# Patient Record
Sex: Female | Born: 1965 | Race: Black or African American | Hispanic: No | Smoking: Former smoker
Health system: Southern US, Community
[De-identification: ages and names within clinical notes are randomized; demographics above are authoritative.]

## PROBLEM LIST (undated history)

## (undated) DIAGNOSIS — T7840XA Allergy, unspecified, initial encounter: Secondary | ICD-10-CM

## (undated) DIAGNOSIS — J45909 Unspecified asthma, uncomplicated: Secondary | ICD-10-CM

## (undated) DIAGNOSIS — D649 Anemia, unspecified: Secondary | ICD-10-CM

## (undated) DIAGNOSIS — G4730 Sleep apnea, unspecified: Secondary | ICD-10-CM

## (undated) DIAGNOSIS — F419 Anxiety disorder, unspecified: Secondary | ICD-10-CM

## (undated) DIAGNOSIS — E119 Type 2 diabetes mellitus without complications: Secondary | ICD-10-CM

## (undated) DIAGNOSIS — I10 Essential (primary) hypertension: Secondary | ICD-10-CM

## (undated) HISTORY — DX: Sleep apnea, unspecified: G47.30

## (undated) HISTORY — DX: Unspecified asthma, uncomplicated: J45.909

## (undated) HISTORY — DX: Allergy, unspecified, initial encounter: T78.40XA

## (undated) HISTORY — DX: Anxiety disorder, unspecified: F41.9

## (undated) HISTORY — DX: Essential (primary) hypertension: I10

## (undated) HISTORY — DX: Type 2 diabetes mellitus without complications: E11.9

---

## 2000-04-18 DIAGNOSIS — R5381 Other malaise: Secondary | ICD-10-CM | POA: Insufficient documentation

## 2001-12-18 DIAGNOSIS — G43909 Migraine, unspecified, not intractable, without status migrainosus: Secondary | ICD-10-CM | POA: Insufficient documentation

## 2002-04-14 DIAGNOSIS — J45909 Unspecified asthma, uncomplicated: Secondary | ICD-10-CM | POA: Insufficient documentation

## 2002-06-25 HISTORY — PX: ABLATION: SHX5711

## 2003-02-16 ENCOUNTER — Observation Stay (HOSPITAL_COMMUNITY): Admission: EM | Admit: 2003-02-16 | Discharge: 2003-02-16 | Payer: Self-pay | Admitting: Plastic Surgery

## 2003-02-26 ENCOUNTER — Ambulatory Visit (HOSPITAL_COMMUNITY): Admission: RE | Admit: 2003-02-26 | Discharge: 2003-02-27 | Payer: Self-pay | Admitting: Internal Medicine

## 2003-10-22 DIAGNOSIS — R609 Edema, unspecified: Secondary | ICD-10-CM | POA: Insufficient documentation

## 2004-05-26 ENCOUNTER — Ambulatory Visit: Payer: Self-pay | Admitting: General Surgery

## 2004-05-26 ENCOUNTER — Other Ambulatory Visit: Payer: Self-pay

## 2004-06-25 DIAGNOSIS — Z87891 Personal history of nicotine dependence: Secondary | ICD-10-CM | POA: Insufficient documentation

## 2007-11-14 ENCOUNTER — Ambulatory Visit: Payer: Self-pay | Admitting: Family Medicine

## 2007-11-17 ENCOUNTER — Ambulatory Visit: Payer: Self-pay | Admitting: Family Medicine

## 2007-12-11 ENCOUNTER — Ambulatory Visit: Payer: Self-pay | Admitting: Cardiology

## 2010-07-05 LAB — HM PAP SMEAR: HM PAP: NEGATIVE

## 2013-02-19 ENCOUNTER — Ambulatory Visit: Payer: Self-pay | Admitting: Family Medicine

## 2013-12-14 DIAGNOSIS — R809 Proteinuria, unspecified: Secondary | ICD-10-CM | POA: Insufficient documentation

## 2014-05-26 LAB — HEMOGLOBIN A1C: HEMOGLOBIN A1C: 6.7 % — AB (ref 4.0–6.0)

## 2014-08-05 LAB — BASIC METABOLIC PANEL
BUN: 11 mg/dL (ref 4–21)
Creatinine: 0.7 mg/dL (ref 0.5–1.1)
GLUCOSE: 91 mg/dL
POTASSIUM: 4.3 mmol/L (ref 3.4–5.3)
SODIUM: 139 mmol/L (ref 137–147)

## 2014-08-05 LAB — LIPID PANEL
CHOLESTEROL: 163 mg/dL (ref 0–200)
HDL: 57 mg/dL (ref 35–70)
LDL CALC: 80 mg/dL
Triglycerides: 130 mg/dL (ref 40–160)

## 2014-09-22 LAB — HM MAMMOGRAPHY

## 2014-12-16 IMAGING — CR DG LUMBAR SPINE 2-3V
1 series · 3 of 3 positions shown · non-contrast
Comparison: none

REASON FOR EXAM: hip pain back pain
COMMENTS:

[Series 1: ap · 0.17mm/px · 3 of 3 slices shown]
[im 1/3]
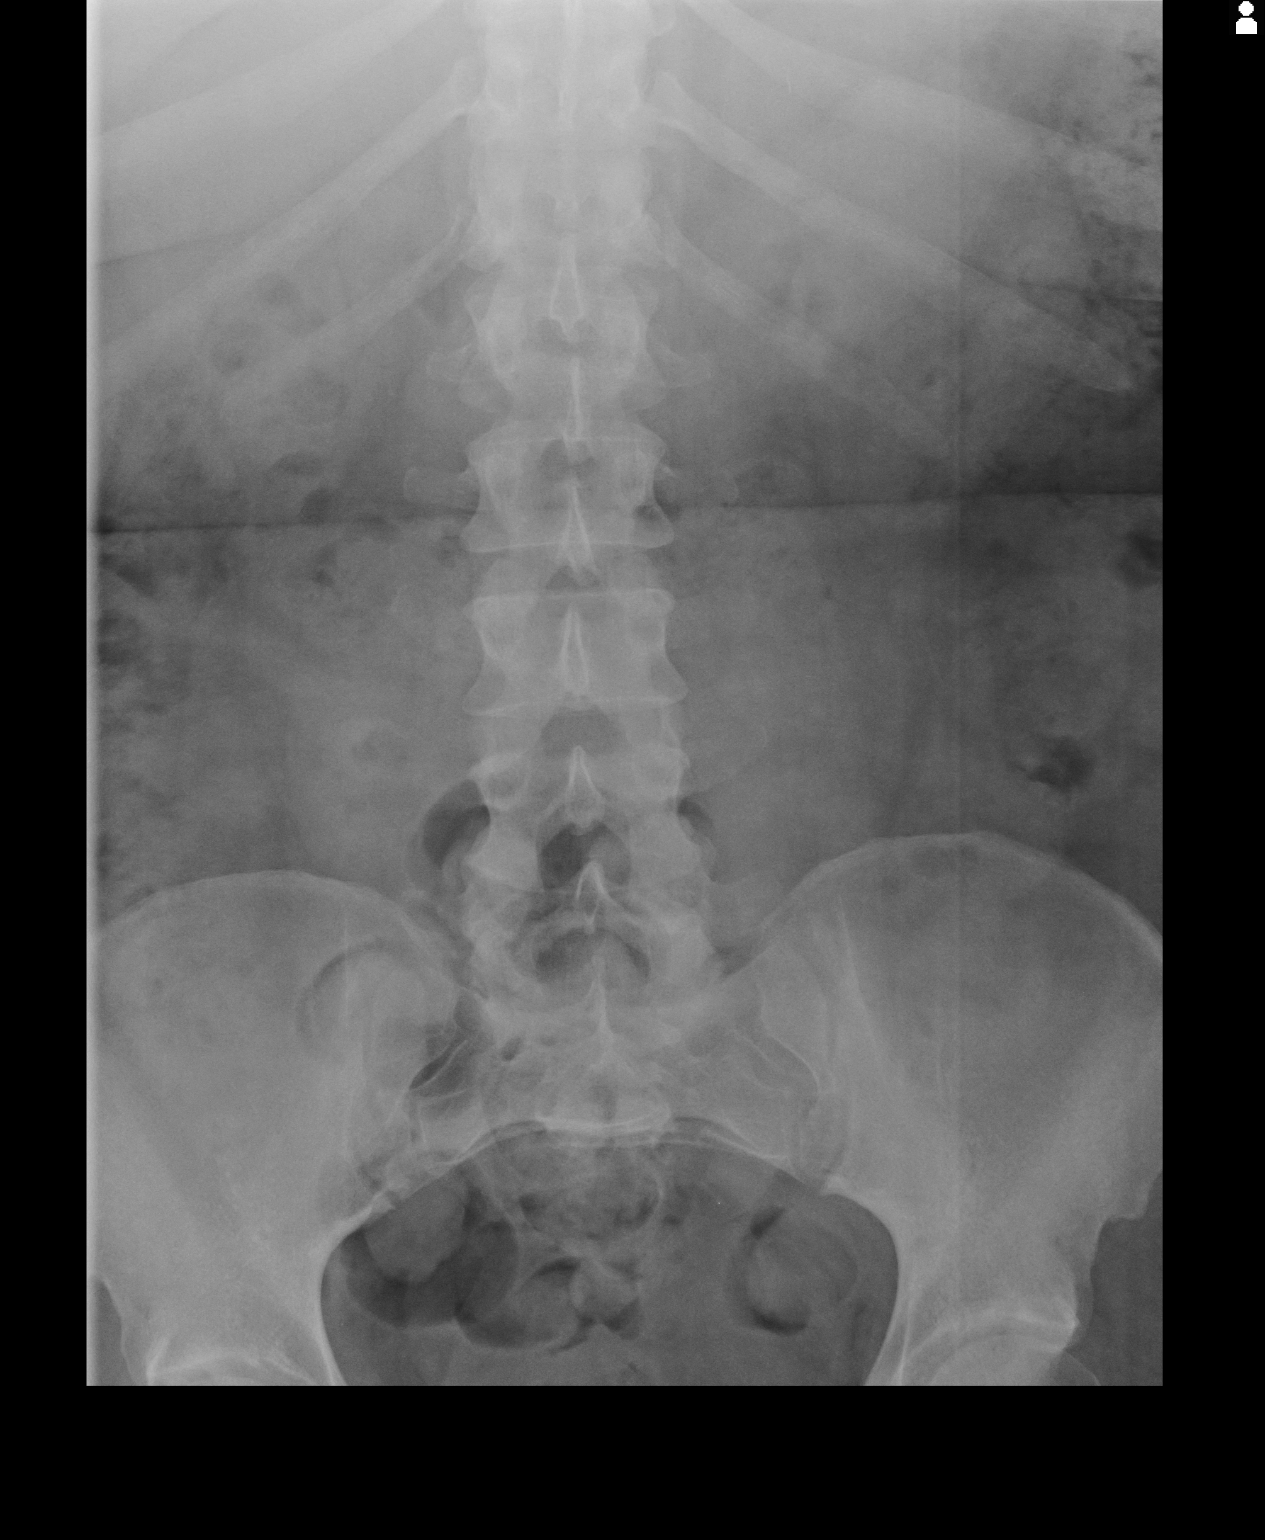
[im 2/3]
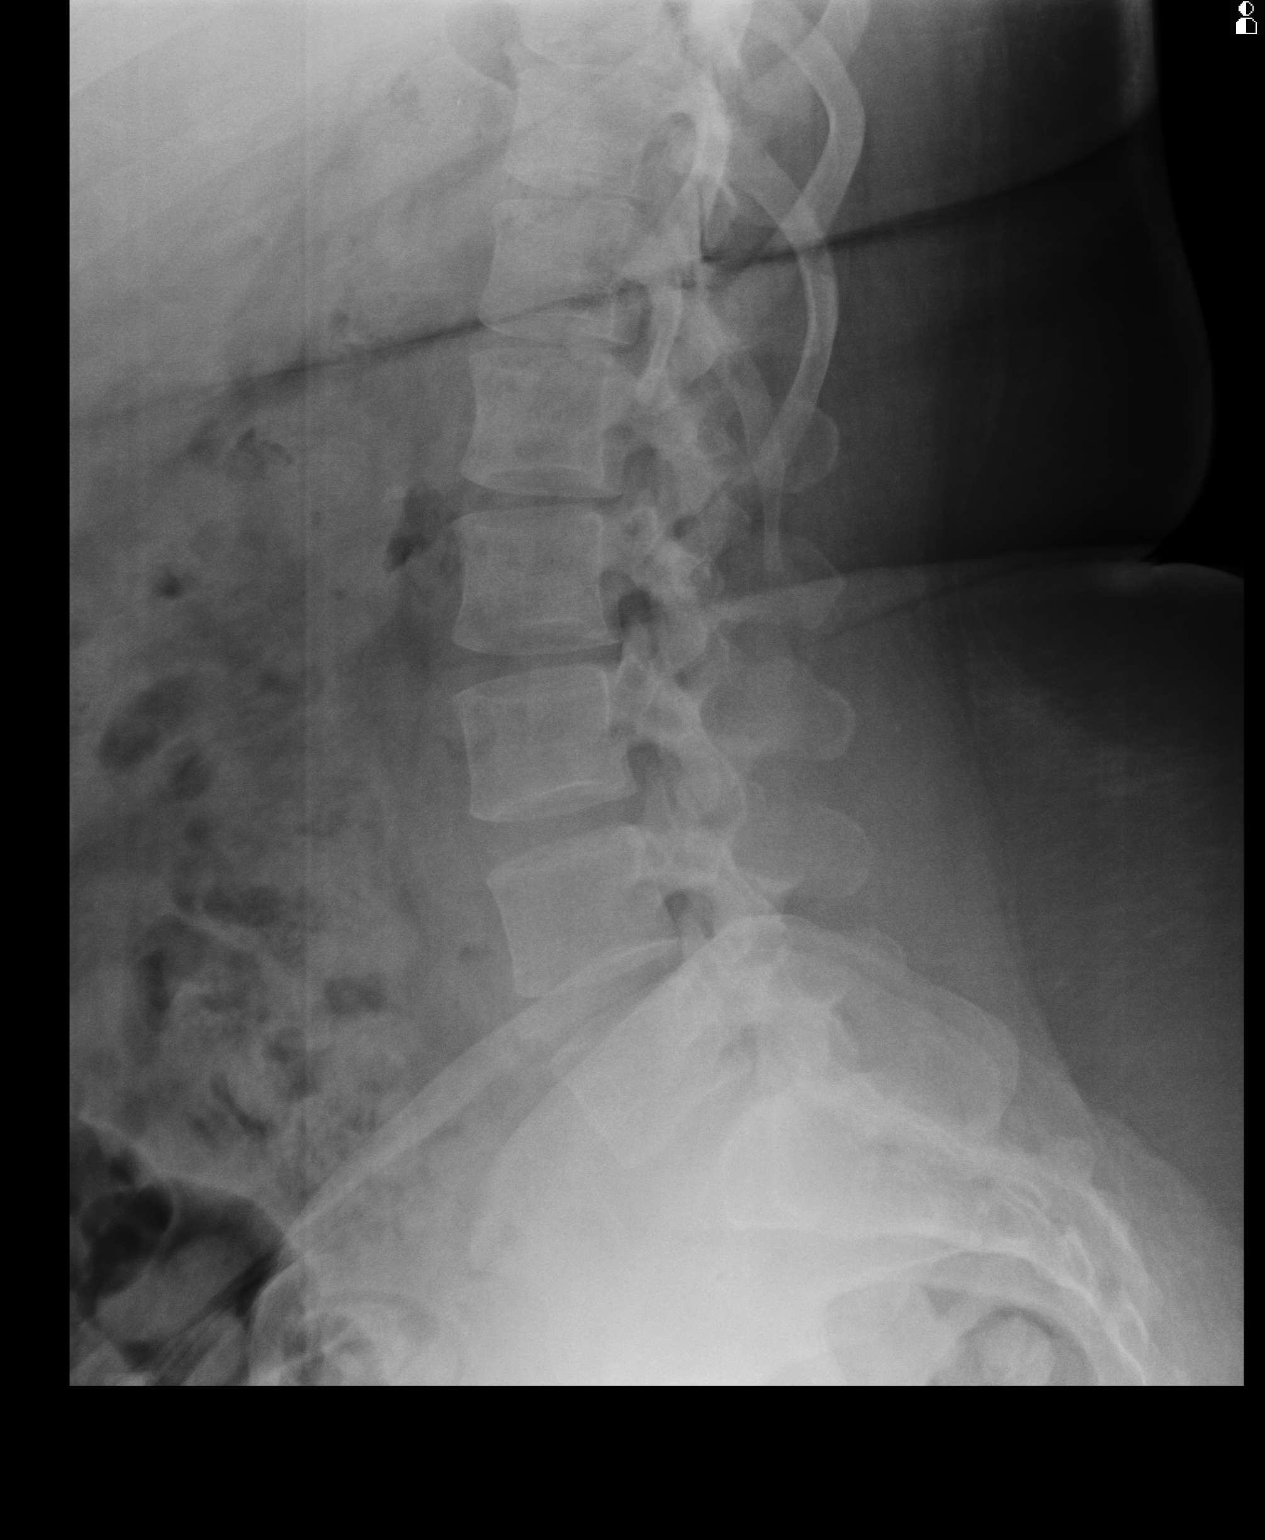
[im 3/3]
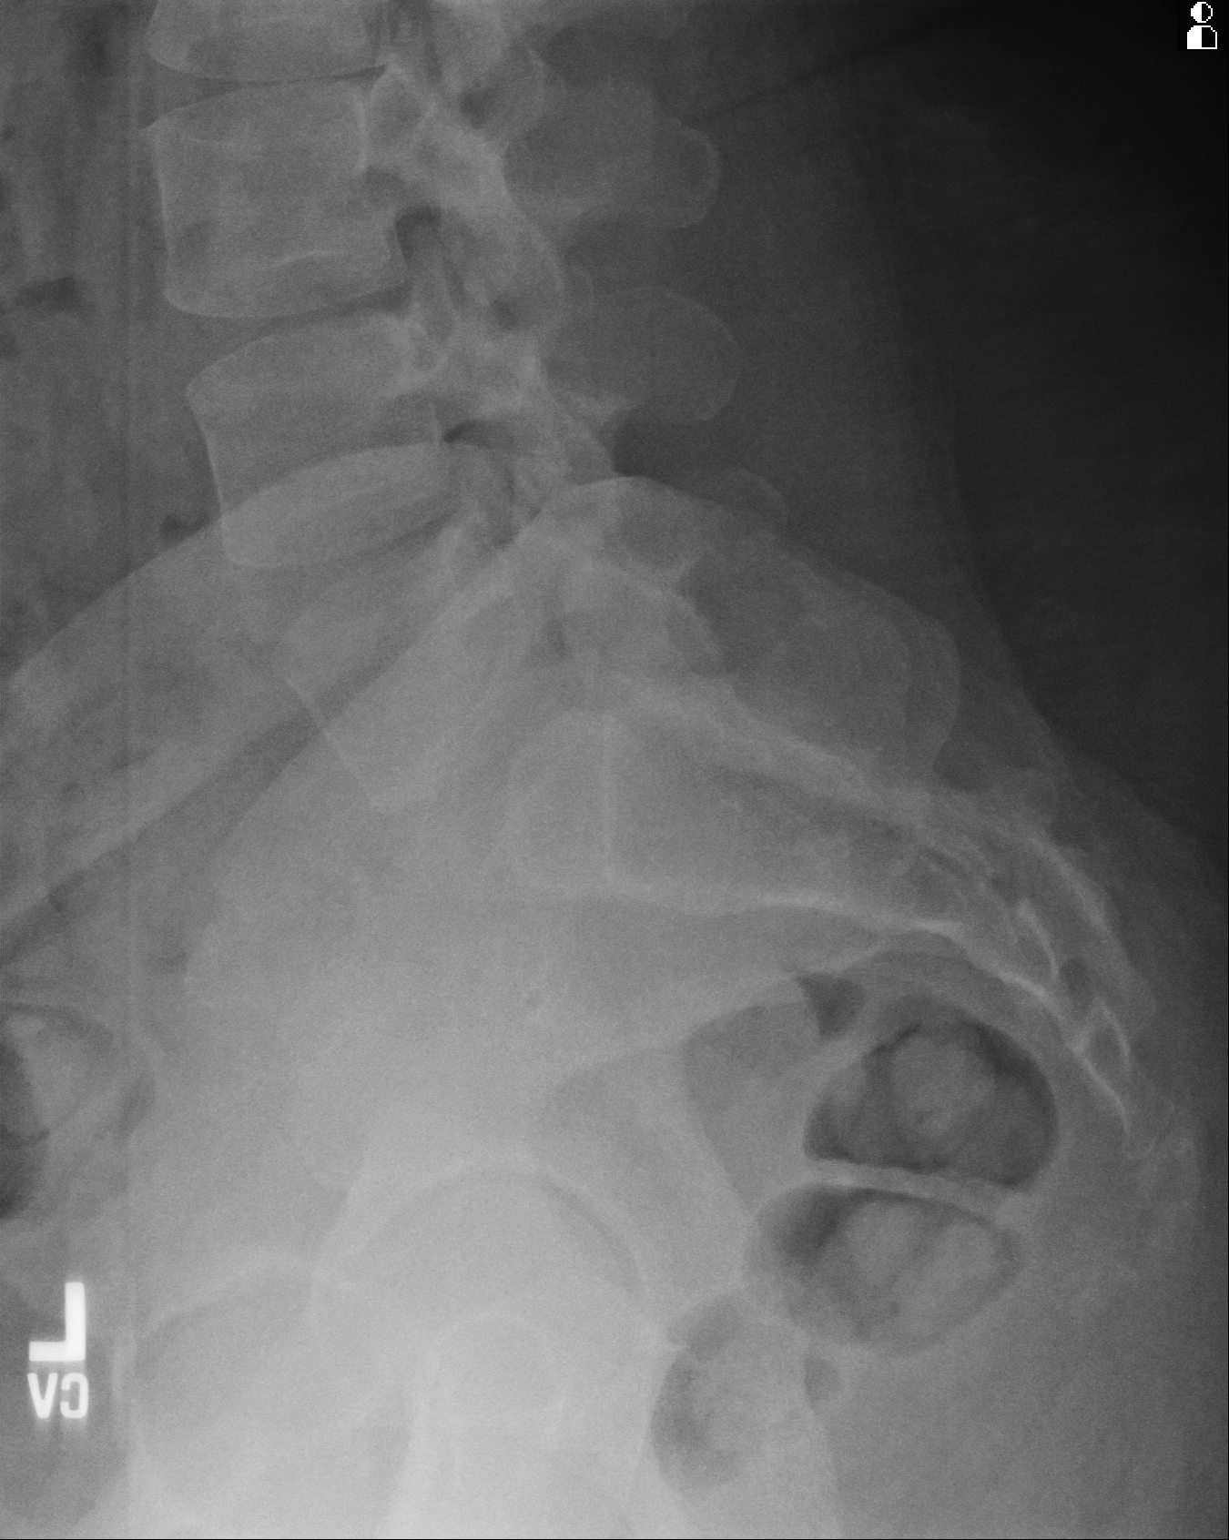

[3 of 3 positions shown; findings below may reference images not displayed]

PROCEDURE:     KDR - KDXR LUMBAR SPINE AP AND LATERAL  - February 19, 2013 [DATE]

RESULT:     The lumbar vertebral bodies are preserved in height. The
intervertebral disc space heights are reasonably well maintained. There is
no pars defect nor spondylolisthesis. The pedicles and transverse processes
appear intact. The observed portions of the sacrum are normal.
IMPRESSION: There is no acute bony abnormality of the lumbar spine nor
evidence of significant degenerative change.

[REDACTED]

## 2015-01-04 ENCOUNTER — Other Ambulatory Visit: Payer: Self-pay | Admitting: Family Medicine

## 2015-01-10 ENCOUNTER — Other Ambulatory Visit: Payer: Self-pay | Admitting: Family Medicine

## 2015-01-17 ENCOUNTER — Ambulatory Visit (INDEPENDENT_AMBULATORY_CARE_PROVIDER_SITE_OTHER): Payer: BC Managed Care – PPO | Admitting: Family Medicine

## 2015-01-17 ENCOUNTER — Encounter: Payer: Self-pay | Admitting: Family Medicine

## 2015-01-17 VITALS — BP 110/68 | HR 85 | Temp 98.7°F | Resp 16 | Wt 262.0 lb

## 2015-01-17 DIAGNOSIS — M25559 Pain in unspecified hip: Secondary | ICD-10-CM | POA: Insufficient documentation

## 2015-01-17 DIAGNOSIS — M549 Dorsalgia, unspecified: Secondary | ICD-10-CM | POA: Insufficient documentation

## 2015-01-17 DIAGNOSIS — G4733 Obstructive sleep apnea (adult) (pediatric): Secondary | ICD-10-CM | POA: Insufficient documentation

## 2015-01-17 DIAGNOSIS — N39498 Other specified urinary incontinence: Secondary | ICD-10-CM | POA: Diagnosis not present

## 2015-01-17 DIAGNOSIS — M545 Low back pain, unspecified: Secondary | ICD-10-CM | POA: Insufficient documentation

## 2015-01-17 DIAGNOSIS — IMO0001 Reserved for inherently not codable concepts without codable children: Secondary | ICD-10-CM | POA: Insufficient documentation

## 2015-01-17 DIAGNOSIS — R229 Localized swelling, mass and lump, unspecified: Secondary | ICD-10-CM

## 2015-01-17 DIAGNOSIS — Z9989 Dependence on other enabling machines and devices: Secondary | ICD-10-CM

## 2015-01-17 NOTE — Progress Notes (Signed)
Patient: Tara Byrd Female    DOB: 10/14/1965   48 y.o.   MRN: [id number] Visit Date: 01/17/2015  Today's Provider: [name], MD   Chief Complaint  Patient presents with  . Mass    intermittently for several years   Subjective:    HPI  Skin lesions    Patient comes in today stating she has multiple knots on her lower legs and feet. Patient reports that these knots have been there for several years. Knots are painful and worsen when walking. Patient states the knots change the color of her skin to a dark brown color. She wishes to see dermatology for further evaluation.    Incontinence: Patient states that for the past 7-8 months she has had incontinence during her menstrual cycle. Patient states this is the only time she experiences incontinence. Patient denies, painful urination or blood in her urine.   Allergies  Allergen Reactions  . Aspirin   . Enalapril Maleate Cough   Previous Medications   ALBUTEROL (VENTOLIN HFA) 108 (90 BASE) MCG/ACT INHALER    Inhale 2 puffs into the lungs every 6 (six) hours as needed.   AMITRIPTYLINE (ELAVIL) 10 MG TABLET    Take 1-2 tablets by mouth every evening.   AMLODIPINE-VALSARTAN-HCTZ 5-160-12.5 MG TABS    Take 1 tablet by mouth daily.   ASPIRIN 81 MG TABLET    Take 1 tablet by mouth daily.   CYANOCOBALAMIN (VITAMIN B-12 CR) 1000 MCG TBCR    Take 1 tablet by mouth daily.   GLUCOSE BLOOD (CLEVER CHEK AUTO-CODE VOICE) TEST STRIP    1 strip. As directed   INSULIN GLARGINE (LANTUS SOLOSTAR) 100 UNIT/ML SOLOSTAR PEN    Inject 30 Units into the skin every morning.   LIRAGLUTIDE (VICTOZA) 18 MG/3ML SOPN    Inject 1.2 mg into the skin daily.   METFORMIN (GLUCOPHAGE-XR) 500 MG 24 HR TABLET    Take 2 tablets by mouth 2 (two) times daily.   MONTELUKAST (SINGULAIR) 10 MG TABLET    TAKE 1 TABLET BY MOUTH ONCE DAILY   NECON 7/7/7 0.5/0.75/1-35 MG-MCG TABLET    TAKE 1 TABLET BY MOUTH ONCE DAILY   PROPRANOLOL (INDERAL) 40 MG TABLET     Take 1 tablet by mouth daily as needed.    Review of Systems  Genitourinary: Negative for dysuria, urgency, frequency, hematuria, flank pain, vaginal bleeding, vaginal discharge, difficulty urinating and vaginal pain.  Musculoskeletal: Positive for myalgias (lower legs and feet).  Skin: Positive for color change.  All other systems reviewed and are negative.   History  Substance Use Topics  . Smoking status: Former Smoker -- 0.50 packs/day for 20 years    Types: Cigarettes    Quit date: 06/25/2002  . Smokeless tobacco: Not on file  . Alcohol Use: No   Objective:   BP 110/68 mmHg  Pulse 85  Temp(Src) 98.7 F (37.1 C) (Oral)  Resp 16  Wt 262 lb (118.842 kg)  SpO2 98%  Physical Exam General Appearance:    Alert, cooperative, no distress, obese  Eyes:    PERRL, conjunctiva/corneas clear, EOM's intact       Lungs:     Clear to auscultation bilaterally, respirations unlabored  Heart:    Regular rate and rhythm  Neurologic:   Awake, alert, oriented x 3. No apparent focal neurological           defect.   Skin:    Several pea sized subcutaneous nodules  on both ankles and feet. Patches of hyperpigmentation around nodules.          Assessment & Plan:     1. Multiple skin nodules  - Ambulatory referral to Dermatology  2. Other urinary incontinence  - Ambulatory referral to Urology        [name], MD  [facility]

## 2015-02-01 ENCOUNTER — Other Ambulatory Visit: Payer: Self-pay | Admitting: Family Medicine

## 2015-02-01 NOTE — Telephone Encounter (Signed)
Last ov was on 01/17/2015

## 2015-02-11 DIAGNOSIS — R32 Unspecified urinary incontinence: Secondary | ICD-10-CM | POA: Insufficient documentation

## 2015-02-14 ENCOUNTER — Other Ambulatory Visit: Payer: Self-pay | Admitting: Family Medicine

## 2015-07-27 ENCOUNTER — Other Ambulatory Visit: Payer: Self-pay | Admitting: Family Medicine

## 2015-08-05 ENCOUNTER — Other Ambulatory Visit: Payer: Self-pay | Admitting: Family Medicine

## 2015-09-01 ENCOUNTER — Other Ambulatory Visit: Payer: Self-pay | Admitting: Family Medicine

## 2015-09-13 ENCOUNTER — Ambulatory Visit (INDEPENDENT_AMBULATORY_CARE_PROVIDER_SITE_OTHER): Payer: BC Managed Care – PPO | Admitting: Family Medicine

## 2015-09-13 ENCOUNTER — Encounter: Payer: Self-pay | Admitting: Family Medicine

## 2015-09-13 VITALS — BP 138/68 | HR 84 | Temp 98.2°F | Resp 16 | Ht 65.0 in | Wt 262.0 lb

## 2015-09-13 DIAGNOSIS — Z124 Encounter for screening for malignant neoplasm of cervix: Secondary | ICD-10-CM | POA: Diagnosis not present

## 2015-09-13 DIAGNOSIS — N951 Menopausal and female climacteric states: Secondary | ICD-10-CM | POA: Diagnosis not present

## 2015-09-13 DIAGNOSIS — E1165 Type 2 diabetes mellitus with hyperglycemia: Secondary | ICD-10-CM | POA: Diagnosis not present

## 2015-09-13 DIAGNOSIS — Z01419 Encounter for gynecological examination (general) (routine) without abnormal findings: Secondary | ICD-10-CM | POA: Diagnosis not present

## 2015-09-13 DIAGNOSIS — Z794 Long term (current) use of insulin: Secondary | ICD-10-CM

## 2015-09-13 DIAGNOSIS — I252 Old myocardial infarction: Secondary | ICD-10-CM | POA: Diagnosis not present

## 2015-09-13 DIAGNOSIS — I10 Essential (primary) hypertension: Secondary | ICD-10-CM | POA: Diagnosis not present

## 2015-09-13 DIAGNOSIS — IMO0001 Reserved for inherently not codable concepts without codable children: Secondary | ICD-10-CM

## 2015-09-13 NOTE — Progress Notes (Signed)
Patient: Tara Byrd, Female    DOB: [birth year], 50 y.o.   MRN: [id number] Visit Date: 09/13/2015  Today's Provider: [name], MD   Chief Complaint  Patient presents with  . Annual Exam  . Follow-up    Vitamin B12 Deficieny  . Hypertension    follow up  . Diabetes    follow up   Subjective:    Annual physical exam Tara Byrd is a 50 y.o. female who presents today for health maintenance and complete physical. She feels fairly well. She reports exercising regularly. She reports she is sleeping well. She does complaint of hot flashes, but her periods remain very regular on current OCPs.   ----------------------------------------------------------------- Follow up Vitamin B12 Deficiency: Last office visit was 2 years ago and no changes were made. Patient reports good compliance with treatment.    Diabetes Mellitus Type II, Follow-up:   Lab Results  Component Value Date   HGBA1C 6.7* 05/26/2014    Last seen for diabetes 2 years ago. Diabetes is Manage by Endocrinologist Dr. Nira Retort Destou. Patient follows up with her Endocrinologist every 3 months.  Management since then includes no changes. She reports good compliance with treatment. She is not having side effects.  Current symptoms include none and have been stable. Most Recent Eye Exam: 2 weeks ago Weight trend: increasing steadily Prior visit with dietician: no Current diet: in general, a "healthy" diet   Current exercise: cardiovascular workout on exercise equipment  Pertinent Labs:    Component Value Date/Time   CHOL 163 08/05/2014   TRIG 130 08/05/2014   HDL 57 08/05/2014   LDLCALC 80 08/05/2014   CREATININE 0.7 08/05/2014    Wt Readings from Last 3 Encounters:  01/17/15 262 lb (118.842 kg)  05/26/14 250 lb (113.399 kg)    ------------------------------------------------------------------------   Hypertension, follow-up:  BP Readings from Last 3 Encounters:  01/17/15 110/68   05/26/14 132/80    She was last seen for hypertension 2 years ago.  BP at that visit was 132/80. Management since that visit includes no changes. She reports good compliance with treatment. She is not having side effects.  She is exercising. She is adherent to low salt diet.   Outside blood pressures are checked at work; patient states it is usually normal. She is experiencing palpitations.  Patient denies chest pain, chest pressure/discomfort, claudication, dyspnea, exertional chest pressure/discomfort, fatigue, irregular heart beat, lower extremity edema, near-syncope, orthopnea and paroxysmal nocturnal dyspnea.   Cardiovascular risk factors include diabetes mellitus and hypertension.  Use of agents associated with hypertension: NSAIDS.     Weight trend: increasing steadily Wt Readings from Last 3 Encounters:  01/17/15 262 lb (118.842 kg)  05/26/14 250 lb (113.399 kg)    Current diet: in general, a "healthy" diet    ------------------------------------------------------------------------  Last labs at [facility] were 07/04/15 with TC=160, Tg=190, HDL=54, LDL=68, VLDL=38, LDL(D)=72.3, and Aic=7.4  Review of Systems  Constitutional: Negative for fever, chills and fatigue.  HENT: Positive for postnasal drip and rhinorrhea. Negative for congestion, ear pain, sneezing and sore throat.   Eyes: Positive for itching. Negative for pain and redness.  Respiratory: Positive for apnea. Negative for cough, shortness of breath and wheezing.   Cardiovascular: Positive for palpitations. Negative for chest pain and leg swelling.  Gastrointestinal: Negative for nausea, abdominal pain, diarrhea, constipation and blood in stool.  Endocrine: Negative for polydipsia and polyphagia.  Genitourinary: Positive for enuresis. Negative for dysuria, hematuria, flank pain, vaginal bleeding, vaginal  discharge and pelvic pain.  Musculoskeletal: Positive for gait problem. Negative for back pain, joint swelling and  arthralgias.  Skin: Negative for rash.  Allergic/Immunologic: Positive for environmental allergies and food allergies.  Neurological: Positive for dizziness and light-headedness. Negative for tremors, seizures, weakness, numbness and headaches.  Hematological: Negative for adenopathy.  Psychiatric/Behavioral: Negative.  Negative for behavioral problems, confusion and dysphoric mood. The patient is not nervous/anxious and is not hyperactive.     Social History      She  reports that she quit smoking about 13 years ago. Her smoking use included Cigarettes. She has a 10 pack-year smoking history. She does not have any smokeless tobacco history on file. She reports that she does not drink alcohol or use illicit drugs.       Social History   Social History  . Marital Status: Divorced    Spouse Name: N/A  . Number of Children: 1  . Years of Education: College   Occupational History  . Employed     works at [employer]   Social History Main Topics  . Smoking status: Former Smoker -- 0.50 packs/day for 20 years    Types: Cigarettes    Quit date: 06/25/2002  . Smokeless tobacco: Not on file  . Alcohol Use: No  . Drug Use: No  . Sexual Activity: Not on file   Other Topics Concern  . Not on file   Social History Narrative    Past Medical History  Diagnosis Date  . Asthma   . Anxiety   . Diabetes mellitus without complication   . Sleep apnea   . Hypertension      Patient Active Problem List   Diagnosis Date Noted  . Menopausal symptoms 09/13/2015  . Back pain 01/17/2015  . Contraception 01/17/2015  . Arthralgia of hip 01/17/2015  . Obstructive sleep apnea 01/17/2015  . Albuminuria 12/14/2013  . Vitamin B12 deficiency anemia 10/14/2009  . Diabetes mellitus type 2, uncontrolled (HCC) 06/29/2009  . Rotator cuff syndrome 02/13/2009  . Chronic nonalcoholic liver disease 11/14/2007  . Contact dermatitis 10/30/2004  . History of tobacco use 06/25/2004  . Edema 10/22/2003  .  Obesity 10/22/2003  . Symptomatic premature ventricular contractions 08/27/2003  . History of MI (myocardial infarction) 06/25/2002  . Asthma without status asthmaticus 04/14/2002  . Allergic rhinitis 03/20/2002  . Anxiety disorder 03/11/2002  . Headache, migraine 12/18/2001  . Essential (primary) hypertension 10/30/1999    Past Surgical History  Procedure Laterality Date  . Ablation  2004    Cardiac  . Cesarean section  85    Family History        Family Status  Relation Status Death Age  . Mother Deceased 72    Cause of death: Renal Failure  . Father Alive   . Sister Alive   . Brother Alive         Her family history includes Asthma in her brother; Colon polyps (age of onset: 82) in her father; Diabetes in her father, mother, and sister; Heart failure in her mother; Kidney disease in her mother; Ulcers in her brother.    Allergies  Allergen Reactions  . Aspirin   . Enalapril Maleate Cough    Previous Medications   ALBUTEROL (VENTOLIN HFA) 108 (90 BASE) MCG/ACT INHALER    Inhale 2 puffs into the lungs every 6 (six) hours as needed.   AMITRIPTYLINE (ELAVIL) 10 MG TABLET    Take 1-2 tablets by mouth every evening.   AMLODIPINE-VALSARTAN-HCTZ 5-160-12.5  MG TABS    TAKE 1 TABLET BY MOUTH ONCE DAILY *NEED TO SCHEDULE OFFICE VISIT*   ASPIRIN 81 MG TABLET    Take 1 tablet by mouth daily.   CYANOCOBALAMIN (VITAMIN B-12 CR) 1000 MCG TBCR    Take 1 tablet by mouth daily.   GLUCOSE BLOOD (CLEVER CHEK AUTO-CODE VOICE) TEST STRIP    1 strip. As directed   INSULIN GLARGINE (BASAGLAR KWIKPEN [place])    Inject 30 Units into the skin every morning. As directed   LIRAGLUTIDE (VICTOZA) 18 MG/3ML SOPN    Inject 1.2 mg into the skin daily.   METFORMIN (GLUCOPHAGE-XR) 500 MG 24 HR TABLET    TAKE 2 TABLETS (1,000 MG TOTAL) BY MOUTH TWICE DAILY   MONTELUKAST (SINGULAIR) 10 MG TABLET    TAKE 1 TABLET BY MOUTH ONCE DAILY   NECON 7/7/7 0.5/0.75/1-35 MG-MCG TABLET    TAKE 1 TABLET BY MOUTH ONCE  DAILY (PATIENT NEEDS TO SCHEDULE VISIT FOR FOLLOW-UP)   PROPRANOLOL (INDERAL) 40 MG TABLET    Take 1 tablet by mouth daily as needed.    Patient Care Team: Malva Limes, MD as PCP - General (Family Medicine) Venetia Night, MD as Referring Physician (Endocrinology)     Objective:   Vitals: BP 138/68 mmHg  Pulse 84  Temp(Src) 98.2 F (36.8 C) (Oral)  Resp 16  Ht  (1.651 m)  Wt 262 lb (118.842 kg)  BMI 43.60 kg/m2  LMP  (Within Weeks)   Physical Exam  General Appearance:    Alert, cooperative, no distress, appears stated age, obese  Head:    Normocephalic, without obvious abnormality, atraumatic  Eyes:    PERRL, conjunctiva/corneas clear, EOM's intact, fundi    benign, both eyes  Ears:    Normal TM's and external ear canals, both ears  Nose:   Nares normal, septum midline, mucosa normal, no drainage    or sinus tenderness  Throat:   Lips, mucosa, and tongue normal; teeth and gums normal  Neck:   Supple, symmetrical, trachea midline, no adenopathy;    thyroid:  no enlargement/tenderness/nodules; no carotid   bruit or JVD  Back:     Symmetric, no curvature, ROM normal, no CVA tenderness  Lungs:     Clear to auscultation bilaterally, respirations unlabored  Chest Wall:    No tenderness or deformity   Heart:    Regular rate and rhythm, S1 and S2 normal, no murmur, rub   or gallop  Breast Exam:    normal appearance, no masses or tenderness  Abdomen:     Soft, non-tender, bowel sounds active all four quadrants,    no masses, no organomegaly  Pelvic:    cervix normal in appearance and external genitalia normal  Extremities:   Extremities normal, atraumatic, no cyanosis or edema  Pulses:   2+ and symmetric all extremities  Skin:   Skin color, texture, turgor normal. Several SK on back chest and neck.   Lymph nodes:   Cervical, supraclavicular, and axillary nodes normal  Neurologic:   CNII-XII intact, normal strength, sensation and reflexes    throughout    Depression  Screen PHQ 2/9 Scores 09/13/2015  PHQ - 2 Score 0  PHQ- 9 Score 1   Current Exercise Habits: Structured exercise class, Type of exercise: treadmill, Time (Minutes): 20, Frequency (Times/Week): 2, Weekly Exercise (Minutes/Week): 40, Intensity: Moderate Exercise limited by: None identified    Assessment & Plan:     Routine Health Maintenance and Physical Exam  Exercise Activities and Dietary recommendations Goals    None      Immunization History  Administered Date(s) Administered  . Pneumococcal Polysaccharide-23 07/25/2012  . Tdap 06/25/2006    Health Maintenance  Topic Date Due  . FOOT EXAM  05/15/1976  . OPHTHALMOLOGY EXAM  05/15/1976  . HIV Screening  05/15/1981  . PAP SMEAR  07/05/2013  . HEMOGLOBIN A1C  11/25/2014  . INFLUENZA VACCINE  01/24/2016 (Originally 01/24/2015)  . TETANUS/TDAP  06/25/2016  . PNEUMOCOCCAL POLYSACCHARIDE VACCINE (2) 07/25/2017      Discussed health benefits of physical activity, and encouraged her to engage in regular exercise appropriate for her age and condition.    --------------------------------------------------------------------  1. Well woman exam with routine gynecological exam  - Comprehensive metabolic panel  2. Essential hypertension Well controlled.  Continue current medications.   - EKG 12-Lead  3. Menopausal symptoms  - FSH - LH  4. Type 2 diabetes mellitus without complication, with long-term current use of insulin (HCC) Continue regular follow up endocrinology.   5. Screening for cervical cancer  - Pap IG (Image Guided)

## 2015-09-14 ENCOUNTER — Other Ambulatory Visit: Payer: Self-pay | Admitting: Family Medicine

## 2015-09-18 LAB — COMPREHENSIVE METABOLIC PANEL
ALBUMIN: 3.8 g/dL (ref 3.5–5.5)
ALK PHOS: 73 IU/L (ref 39–117)
ALT: 9 IU/L (ref 0–32)
AST: 11 IU/L (ref 0–40)
Albumin/Globulin Ratio: 1 — ABNORMAL LOW (ref 1.2–2.2)
BUN / CREAT RATIO: 13 (ref 9–23)
BUN: 9 mg/dL (ref 6–24)
Bilirubin Total: 0.5 mg/dL (ref 0.0–1.2)
CHLORIDE: 98 mmol/L (ref 96–106)
CO2: 24 mmol/L (ref 18–29)
Calcium: 9.6 mg/dL (ref 8.7–10.2)
Creatinine, Ser: 0.72 mg/dL (ref 0.57–1.00)
GFR calc Af Amer: 114 mL/min/{1.73_m2} (ref >59)
GFR calc non Af Amer: 99 mL/min/{1.73_m2} (ref >59)
GLOBULIN, TOTAL: 3.7 g/dL (ref 1.5–4.5)
GLUCOSE: 118 mg/dL — AB (ref 65–99)
Potassium: 3.9 mmol/L (ref 3.5–5.2)
SODIUM: 140 mmol/L (ref 134–144)
Total Protein: 7.5 g/dL (ref 6.0–8.5)

## 2015-09-18 LAB — LUTEINIZING HORMONE: LH: 7.4 m[IU]/mL

## 2015-09-18 LAB — PAP IG (IMAGE GUIDED): PAP SMEAR COMMENT: 0

## 2015-09-18 LAB — FOLLICLE STIMULATING HORMONE: FSH: 14.7 m[IU]/mL

## 2015-09-23 ENCOUNTER — Other Ambulatory Visit: Payer: Self-pay | Admitting: Family Medicine

## 2015-09-27 ENCOUNTER — Encounter: Payer: Self-pay | Admitting: Family Medicine

## 2016-01-13 ENCOUNTER — Other Ambulatory Visit: Payer: Self-pay | Admitting: Family Medicine

## 2016-02-01 ENCOUNTER — Other Ambulatory Visit: Payer: Self-pay | Admitting: Family Medicine

## 2016-03-29 ENCOUNTER — Other Ambulatory Visit: Payer: Self-pay | Admitting: Family Medicine

## 2016-05-21 ENCOUNTER — Telehealth: Payer: Self-pay | Admitting: Family Medicine

## 2016-05-21 DIAGNOSIS — Z1211 Encounter for screening for malignant neoplasm of colon: Secondary | ICD-10-CM

## 2016-05-21 NOTE — Telephone Encounter (Signed)
Please advise 

## 2016-05-21 NOTE — Telephone Encounter (Signed)
Pt called saying it was time for her to have a colonoscopy.   She works at [employer] and would like to be referred there to a gastro.  She is not having any problems but family hx of polyps.  Thanks Barth Kirkseri

## 2016-05-21 NOTE — Telephone Encounter (Signed)
[facility].

## 2016-05-25 ENCOUNTER — Ambulatory Visit: Payer: BC Managed Care – PPO | Admitting: Family Medicine

## 2016-06-05 ENCOUNTER — Ambulatory Visit (INDEPENDENT_AMBULATORY_CARE_PROVIDER_SITE_OTHER): Payer: BC Managed Care – PPO | Admitting: Family Medicine

## 2016-06-05 ENCOUNTER — Encounter: Payer: Self-pay | Admitting: Family Medicine

## 2016-06-05 VITALS — BP 126/72 | Temp 98.4°F | Resp 16 | Wt 261.0 lb

## 2016-06-05 DIAGNOSIS — R29898 Other symptoms and signs involving the musculoskeletal system: Secondary | ICD-10-CM | POA: Diagnosis not present

## 2016-06-05 DIAGNOSIS — M542 Cervicalgia: Secondary | ICD-10-CM | POA: Diagnosis not present

## 2016-06-05 MED ORDER — PREDNISONE 10 MG PO TABS: ORAL_TABLET | ORAL | 0 refills | Status: AC

## 2016-06-05 NOTE — Progress Notes (Addendum)
Patient: Tara Byrd Female    DOB: 1966-06-01   50 y.o.   MRN: [id number] Visit Date: 06/05/2016  Today's Provider: Mila Merryonald Shanaya Schneck, MD   Chief Complaint  Patient presents with  . Neck Pain   Subjective:    HPI Patient reports that she has had neck pain X 2 weeks. Patient reports that she injured her neck when she turned wrong in her bed and had sudden onset of pain and head a pop in her neck. She reports that the pain radiates to both of her shoulders and is constant. Patient also reports that she went to the urgent care last week and they prescribed Norco 5/325mg  and Flexeril 10mg . She reports that she can not tell if the Flexeril has helped with her muscle spasms that she has had in her shoulders. She states Xrays were done at Urgent Care and were normal.     Allergies  Allergen Reactions  . Aspirin   . Enalapril Maleate Cough     Current Outpatient Prescriptions:  .  amitriptyline (ELAVIL) 10 MG tablet, Take 1-2 tablets by mouth every evening., Disp: , Rfl:  .  Amlodipine-Valsartan-HCTZ 5-160-12.5 MG TABS, Take 1 tablet by mouth daily., Disp: 90 tablet, Rfl: 3 .  aspirin 81 MG tablet, Take 1 tablet by mouth daily., Disp: , Rfl:  .  Cyanocobalamin (VITAMIN B-12 CR) 1000 MCG TBCR, Take 1 tablet by mouth daily., Disp: , Rfl:  .  Insulin Glargine (BASAGLAR KWIKPEN [place]), Inject 30 Units into the skin every morning. As directed, Disp: , Rfl:  .  Liraglutide (VICTOZA) 18 MG/3ML SOPN, Inject 1.2 mg into the skin daily., Disp: , Rfl:  .  metFORMIN (GLUCOPHAGE-XR) 500 MG 24 hr tablet, TAKE 2 TABLETS (1,000 MG TOTAL) BY MOUTH TWICE DAILY, Disp: 120 tablet, Rfl: 2 .  montelukast (SINGULAIR) 10 MG tablet, TAKE 1 TABLET BY MOUTH ONCE DAILY, Disp: 30 tablet, Rfl: 5 .  norethindrone-ethinyl estradiol (NECON 7/7/7) 0.5/0.75/1-35 MG-MCG tablet, Take 1 tablet by mouth daily., Disp: 28 tablet, Rfl: 10 .  propranolol (INDERAL) 40 MG tablet, TAKE 1 TABLET (40MG ) BY MOUTH ONCE DAILY AS  NEEDED, Disp: 30 tablet, Rfl: 5 .  VENTOLIN HFA 108 (90 Base) MCG/ACT inhaler, INHALE 2 PUFFS BY MOUTH EVERY 6 HOURS AS NEEDED, Disp: 1 each, Rfl: 5 .  glucose blood (CLEVER CHEK AUTO-CODE VOICE) test strip, 1 strip. As directed, Disp: , Rfl:   Review of Systems  Constitutional: Negative.   Musculoskeletal: Positive for myalgias, neck pain and neck stiffness. Negative for arthralgias, back pain, gait problem and joint swelling.  Neurological: Negative for dizziness, weakness, numbness and headaches.    Social History  Substance Use Topics  . Smoking status: Former Smoker    Packs/day: 0.50    Years: 20.00    Types: Cigarettes    Quit date: 06/25/2002  . Smokeless tobacco: Not on file  . Alcohol use No   Objective:   BP 126/72 (BP Location: Left Arm, Patient Position: Sitting, Cuff Size: Large)   Temp 98.4 F (36.9 C)   Resp 16   Wt 261 lb (118.4 kg)   BMI 43.43 kg/m   Physical Exam   General Appearance:    Alert, cooperative, no distress  Eyes:    PERRL, conjunctiva/corneas clear, EOM's intact       Lungs:     Clear to auscultation bilaterally, respirations unlabored  Heart:    Regular rate and rhythm  Neurologic:   Awake,  alert, oriented x 3. No apparent focal neurological           defect.   MS:   Tender along cervical spine and paracervical muscles. Head rotation limited to 30 degrees secondary to pain. FROM of both shoulders. +4 MS right uUE and +5 LUE. DTRs 1+ Right 2+ left.         Assessment & Plan:     1. Acute neck pain  - predniSONE (DELTASONE) 10 MG tablet; 6 tablets for 2 days, then 5 for 2 days, then 4 for 2 days, then 3 for 2 days, then 2 for 2 days, then 1 for 2 days.  Dispense: 42 tablet; Refill: 0  2. Right arm weakness  - predniSONE (DELTASONE) 10 MG tablet; 6 tablets for 2 days, then 5 for 2 days, then 4 for 2 days, then 3 for 2 days, then 2 for 2 days, then 1 for 2 days.  Dispense: 42 tablet; Refill: 0  Consider MRI if not rapidly improving on  prednisone.     The entirety of the information documented in the History of Present Illness, Review of Systems and Physical Exam were personally obtained by me. Portions of this information were initially documented by Anson Oregonachelle Presley, CMA and reviewed by me for thoroughness and accuracy.    Mila Merryonald Jameica Couts, MD  [facility]

## 2016-06-12 ENCOUNTER — Telehealth: Payer: Self-pay

## 2016-06-12 NOTE — Telephone Encounter (Signed)
LVM for patient to call office and schedule Colonoscopy.

## 2016-06-15 ENCOUNTER — Telehealth: Payer: Self-pay | Admitting: Gastroenterology

## 2016-06-15 NOTE — Telephone Encounter (Signed)
Patient is returning a call regarding scheduling a colonoscopy °

## 2016-07-09 ENCOUNTER — Telehealth: Payer: Self-pay | Admitting: Gastroenterology

## 2016-07-09 NOTE — Telephone Encounter (Signed)
Patient is returning a call regarding a colonoscopy °

## 2016-07-31 ENCOUNTER — Telehealth: Payer: Self-pay | Admitting: Gastroenterology

## 2016-07-31 NOTE — Telephone Encounter (Signed)
[phone] patient stated she has been playing phone tag for about 2 months and needs to schedule her colonoscopy. Please call her at work before 3:30

## 2016-08-01 ENCOUNTER — Other Ambulatory Visit: Payer: Self-pay

## 2016-08-01 NOTE — Telephone Encounter (Signed)
Gastroenterology Pre-Procedure Review  Request Date:  Requesting Physician: Dr.   PATIENT REVIEW QUESTIONS: The patient responded to the following health history questions as indicated:    1. Are you having any GI issues? no 2. Do you have a personal history of Polyps? no 3. Do you have a family history of Colon Cancer or Polyps? no 4. Diabetes Mellitus? yes (Type 2) 5. Joint replacements in the past 12 months?no 6. Major health problems in the past 3 months?no 7. Any artificial heart valves, MVP, or defibrillator?no    MEDICATIONS & ALLERGIES:    Patient reports the following regarding taking any anticoagulation/antiplatelet therapy:   Plavix, Coumadin, Eliquis, Xarelto, Lovenox, Pradaxa, Brilinta, or Effient? no Aspirin? no  Patient confirms/reports the following medications:  Current Outpatient Prescriptions  Medication Sig Dispense Refill  . amitriptyline (ELAVIL) 10 MG tablet Take 1-2 tablets by mouth every evening.    . Amlodipine-Valsartan-HCTZ 5-160-12.5 MG TABS Take 1 tablet by mouth daily. 90 tablet 3  . aspirin 81 MG tablet Take 1 tablet by mouth daily.    . Cyanocobalamin (VITAMIN B-12 CR) 1000 MCG TBCR Take 1 tablet by mouth daily.    Marland Kitchen. glucose blood (CLEVER CHEK AUTO-CODE VOICE) test strip 1 strip. As directed    . Insulin Glargine (BASAGLAR KWIKPEN [place]) Inject 30 Units into the skin every morning. As directed    . Liraglutide (VICTOZA) 18 MG/3ML SOPN Inject 1.2 mg into the skin daily.    . metFORMIN (GLUCOPHAGE-XR) 500 MG 24 hr tablet TAKE 2 TABLETS (1,000 MG TOTAL) BY MOUTH TWICE DAILY 120 tablet 2  . montelukast (SINGULAIR) 10 MG tablet TAKE 1 TABLET BY MOUTH ONCE DAILY 30 tablet 5  . norethindrone-ethinyl estradiol (NECON 7/7/7) 0.5/0.75/1-35 MG-MCG tablet Take 1 tablet by mouth daily. 28 tablet 10  . propranolol (INDERAL) 40 MG tablet TAKE 1 TABLET (40MG ) BY MOUTH ONCE DAILY AS NEEDED 30 tablet 5  . VENTOLIN HFA 108 (90 Base) MCG/ACT inhaler INHALE 2 PUFFS BY MOUTH  EVERY 6 HOURS AS NEEDED 1 each 5   No current facility-administered medications for this visit.     Patient confirms/reports the following allergies:  Allergies  Allergen Reactions  . Aspirin   . Enalapril Maleate Cough    No orders of the defined types were placed in this encounter.   AUTHORIZATION INFORMATION Primary Insurance: 1D#: Group #:  Secondary Insurance: 1D#: Group #:  SCHEDULE INFORMATION: Date: 09/06/16 Time: Location: [facility]

## 2016-08-01 NOTE — Telephone Encounter (Signed)
[facility] with Wohl on 09/06/16.

## 2016-08-14 NOTE — Telephone Encounter (Signed)
Pt scheduled for a colonoscopy on 09/06/16.

## 2016-09-03 ENCOUNTER — Encounter: Payer: Self-pay | Admitting: *Deleted

## 2016-09-04 LAB — HM DIABETES EYE EXAM

## 2016-09-05 NOTE — Discharge Instructions (Signed)

## 2016-09-06 ENCOUNTER — Ambulatory Visit: Payer: BC Managed Care – PPO | Admitting: Anesthesiology

## 2016-09-06 ENCOUNTER — Ambulatory Visit
Admission: RE | Admit: 2016-09-06 | Discharge: 2016-09-06 | Disposition: A | Discharge status: Home / Self Care | Payer: BC Managed Care – PPO | Source: Ambulatory Visit | Attending: Gastroenterology | Admitting: Gastroenterology

## 2016-09-06 ENCOUNTER — Encounter
Admission: RE | Disposition: A | Discharge status: Home / Self Care | Payer: Self-pay | Source: Ambulatory Visit | Attending: Gastroenterology

## 2016-09-06 DIAGNOSIS — Z1211 Encounter for screening for malignant neoplasm of colon: Secondary | ICD-10-CM | POA: Diagnosis present

## 2016-09-06 DIAGNOSIS — I10 Essential (primary) hypertension: Secondary | ICD-10-CM | POA: Insufficient documentation

## 2016-09-06 DIAGNOSIS — Z794 Long term (current) use of insulin: Secondary | ICD-10-CM | POA: Insufficient documentation

## 2016-09-06 DIAGNOSIS — D649 Anemia, unspecified: Secondary | ICD-10-CM | POA: Diagnosis not present

## 2016-09-06 DIAGNOSIS — Z8371 Family history of colonic polyps: Secondary | ICD-10-CM | POA: Insufficient documentation

## 2016-09-06 DIAGNOSIS — K641 Second degree hemorrhoids: Secondary | ICD-10-CM | POA: Diagnosis not present

## 2016-09-06 DIAGNOSIS — Z87891 Personal history of nicotine dependence: Secondary | ICD-10-CM | POA: Insufficient documentation

## 2016-09-06 DIAGNOSIS — J45909 Unspecified asthma, uncomplicated: Secondary | ICD-10-CM | POA: Diagnosis not present

## 2016-09-06 DIAGNOSIS — Z79899 Other long term (current) drug therapy: Secondary | ICD-10-CM | POA: Diagnosis not present

## 2016-09-06 DIAGNOSIS — F419 Anxiety disorder, unspecified: Secondary | ICD-10-CM | POA: Insufficient documentation

## 2016-09-06 DIAGNOSIS — R51 Headache: Secondary | ICD-10-CM | POA: Insufficient documentation

## 2016-09-06 DIAGNOSIS — G4730 Sleep apnea, unspecified: Secondary | ICD-10-CM | POA: Insufficient documentation

## 2016-09-06 DIAGNOSIS — E119 Type 2 diabetes mellitus without complications: Secondary | ICD-10-CM | POA: Diagnosis not present

## 2016-09-06 HISTORY — PX: COLONOSCOPY WITH PROPOFOL: SHX5780

## 2016-09-06 HISTORY — DX: Anemia, unspecified: D64.9

## 2016-09-06 LAB — GLUCOSE, CAPILLARY: GLUCOSE-CAPILLARY: 125 mg/dL — AB (ref 65–99)

## 2016-09-06 SURGERY — COLONOSCOPY WITH PROPOFOL
Anesthesia: Monitor Anesthesia Care | Wound class: Contaminated

## 2016-09-06 MED ORDER — LIDOCAINE HCL (CARDIAC) 20 MG/ML IV SOLN
INTRAVENOUS | Status: DC | PRN
  Administered 2016-09-06: 40 mg via INTRAVENOUS

## 2016-09-06 MED ORDER — PROPOFOL 10 MG/ML IV BOLUS
INTRAVENOUS | Status: DC | PRN
  Administered 2016-09-06: 20 mg via INTRAVENOUS
  Administered 2016-09-06: 70 mg via INTRAVENOUS
  Administered 2016-09-06 (×2): 10 mg via INTRAVENOUS
  Administered 2016-09-06: 20 mg via INTRAVENOUS
  Administered 2016-09-06 (×2): 30 mg via INTRAVENOUS

## 2016-09-06 MED ORDER — LACTATED RINGERS IV SOLN
INTRAVENOUS | Status: DC
  Administered 2016-09-06: 08:00:00 via INTRAVENOUS

## 2016-09-06 MED ORDER — SIMETHICONE 40 MG/0.6ML PO SUSP
ORAL | Status: DC | PRN
  Administered 2016-09-06: 09:00:00

## 2016-09-06 SURGICAL SUPPLY — 23 items
CANISTER SUCT 1200ML W/VALVE (MISCELLANEOUS) ×3 IMPLANT
CLIP HMST 235XBRD CATH ROT (MISCELLANEOUS) IMPLANT
CLIP RESOLUTION 360 11X235 (MISCELLANEOUS)
FCP ESCP3.2XJMB 240X2.8X (MISCELLANEOUS)
FORCEPS BIOP RAD 4 LRG CAP 4 (CUTTING FORCEPS) IMPLANT
FORCEPS BIOP RJ4 240 W/NDL (MISCELLANEOUS)
FORCEPS ESCP3.2XJMB 240X2.8X (MISCELLANEOUS) IMPLANT
GOWN CVR UNV OPN BCK APRN NK (MISCELLANEOUS) ×2 IMPLANT
GOWN ISOL THUMB LOOP REG UNIV (MISCELLANEOUS) ×6
INJECTOR VARIJECT VIN23 (MISCELLANEOUS) IMPLANT
KIT DEFENDO VALVE AND CONN (KITS) IMPLANT
KIT ENDO PROCEDURE OLY (KITS) ×3 IMPLANT
MARKER SPOT ENDO TATTOO 5ML (MISCELLANEOUS) IMPLANT
PAD GROUND ADULT SPLIT (MISCELLANEOUS) IMPLANT
PROBE APC STR FIRE (PROBE) IMPLANT
RETRIEVER NET ROTH 2.5X230 LF (MISCELLANEOUS) IMPLANT
SNARE SHORT THROW 13M SML OVAL (MISCELLANEOUS) IMPLANT
SNARE SHORT THROW 30M LRG OVAL (MISCELLANEOUS) IMPLANT
SNARE SNG USE RND 15MM (INSTRUMENTS) IMPLANT
SPOT EX ENDOSCOPIC TATTOO (MISCELLANEOUS)
TRAP ETRAP POLY (MISCELLANEOUS) IMPLANT
VARIJECT INJECTOR VIN23 (MISCELLANEOUS)
WATER STERILE IRR 250ML POUR (IV SOLUTION) ×3 IMPLANT

## 2016-09-06 NOTE — Op Note (Signed)
[facility] Gastroenterology Patient Name: Tara Byrd Procedure Date: 09/06/2016 8:34 AM MRN: [id number] Account #: [id number] Date of Birth: 03-28-1966 Admit Type: Outpatient Age: [age] [facility] Gender: Female Note Status: Finalized Procedure:            Colonoscopy Indications:          Screening for colorectal malignant neoplasm Providers:            Midge Miniumarren Araf Clugston MD, MD Referring MD:         Demetrios Isaacsonald E. Sherrie MustacheFisher, MD (Referring MD) Medicines:            Propofol per Anesthesia Complications:        No immediate complications. Procedure:            Pre-Anesthesia Assessment:                       - Prior to the procedure, a History and Physical was                        performed, and patient medications and allergies were                        reviewed. The patient's tolerance of previous                        anesthesia was also reviewed. The risks and benefits of                        the procedure and the sedation options and risks were                        discussed with the patient. All questions were                        answered, and informed consent was obtained. Prior                        Anticoagulants: The patient has taken no previous                        anticoagulant or antiplatelet agents. ASA Grade                        Assessment: II - A patient with mild systemic disease.                        After reviewing the risks and benefits, the patient was                        deemed in satisfactory condition to undergo the                        procedure.                       After obtaining informed consent, the colonoscope was                        passed under direct vision. Throughout the procedure,  the patient's blood pressure, pulse, and oxygen                        saturations were monitored continuously. The Masthope (S#: [id number]) was introduced  through                        the anus and advanced to the the cecum, identified by                        appendiceal orifice and ileocecal valve. The                        colonoscopy was performed without difficulty. The                        patient tolerated the procedure well. The quality of                        the bowel preparation was excellent. Findings:      The perianal and digital rectal examinations were normal.      Non-bleeding internal hemorrhoids were found during retroflexion. The       hemorrhoids were Grade II (internal hemorrhoids that prolapse but reduce       spontaneously). Impression:           - Non-bleeding internal hemorrhoids.                       - No specimens collected. Recommendation:       - Discharge patient to home.                       - Resume previous diet.                       - Continue present medications.                       - Repeat colonoscopy in 10 years for screening unless                        any change in family history or lower GI problems. Procedure Code(s):    --- Professional ---                       [phone], Colonoscopy, flexible; diagnostic, including                        collection of specimen(s) by brushing or washing, when                        performed (separate procedure) Diagnosis Code(s):    --- Professional ---                       Z12.11, Encounter for screening for malignant neoplasm                        of colon CPT copyright 2016 American Medical Association. All rights reserved. The  codes documented in this report are preliminary and upon coder review may  be revised to meet current compliance requirements. Midge Minium MD, MD 09/06/2016 8:49:52 AM This report has been signed electronically. Number of Addenda: 0 Note Initiated On: 09/06/2016 8:34 AM Scope Withdrawal Time: 0 hours 6 minutes 9 seconds  Total Procedure Duration: 0 hours 9 minutes 50 seconds       [facility]

## 2016-09-06 NOTE — Transfer of Care (Signed)
Immediate Anesthesia Transfer of Care Note  Patient: Tara Byrd  Procedure(s) Performed: Procedure(s) with comments: COLONOSCOPY WITH PROPOFOL (N/A) - Diabetic - insulin sleep apnea  Patient Location: PACU  Anesthesia Type: MAC  Level of Consciousness: awake, alert  and patient cooperative  Airway and Oxygen Therapy: Patient Spontanous Breathing and Patient connected to supplemental oxygen  Post-op Assessment: Post-op Vital signs reviewed, Patient's Cardiovascular Status Stable, Respiratory Function Stable, Patent Airway and No signs of Nausea or vomiting  Post-op Vital Signs: Reviewed and stable  Complications: No apparent anesthesia complications

## 2016-09-06 NOTE — Anesthesia Preprocedure Evaluation (Signed)
Anesthesia Evaluation  Patient identified by MRN, date of birth, ID band Patient awake    Reviewed: Allergy & Precautions, H&P , NPO status , Patient's Chart, lab work & pertinent test results, reviewed documented beta blocker date and time   Airway Mallampati: III  TM Distance: >3 FB Neck ROM: full    Dental no notable dental hx.    Pulmonary asthma , sleep apnea and Continuous Positive Airway Pressure Ventilation , former smoker,    Pulmonary exam normal breath sounds clear to auscultation       Cardiovascular Exercise Tolerance: Good hypertension,  Rhythm:regular Rate:Normal     Neuro/Psych  Headaches, PSYCHIATRIC DISORDERS (anxiety)    GI/Hepatic negative GI ROS, Neg liver ROS,   Endo/Other  diabetes, Type 2  Renal/GU negative Renal ROS  negative genitourinary   Musculoskeletal   Abdominal   Peds  Hematology negative hematology ROS (+) anemia ,   Anesthesia Other Findings   Reproductive/Obstetrics negative OB ROS                             Anesthesia Physical Anesthesia Plan  ASA: II  Anesthesia Plan: MAC   Post-op Pain Management:    Induction:   Airway Management Planned:   Additional Equipment:   Intra-op Plan:   Post-operative Plan:   Informed Consent: I have reviewed the patients History and Physical, chart, labs and discussed the procedure including the risks, benefits and alternatives for the proposed anesthesia with the patient or authorized representative who has indicated his/her understanding and acceptance.   Dental Advisory Given  Plan Discussed with: CRNA  Anesthesia Plan Comments:         Anesthesia Quick Evaluation

## 2016-09-06 NOTE — H&P (Signed)
Tara Miniumarren Cortina Vultaggio, MD [facility] [address] [place], [place] [address] Phone: [phone] Fax : [phone]  Primary Care Physician:  Tara Merryonald Fisher, MD Primary Gastroenterologist:  Tara Byrd  Pre-Procedure History & Physical: HPI:  Tara Byrd is a 51 y.o. female is here for a screening colonoscopy.   Past Medical History:  Diagnosis Date  . Anemia   . Anxiety   . Asthma   . Diabetes mellitus without complication (HCC)   . Hypertension   . Sleep apnea    CPAP - only uses "sometimes"    Past Surgical History:  Procedure Laterality Date  . ABLATION  2004   Cardiac  . CESAREAN SECTION  1986    Prior to Admission medications   Medication Sig Start Date End Date Taking? Authorizing Provider  amitriptyline (ELAVIL) 10 MG tablet Take 1-2 tablets by mouth every evening. 08/10/09  Yes Historical Provider, MD  Amlodipine-Valsartan-HCTZ 5-160-12.5 MG TABS Take 1 tablet by mouth daily. 09/14/15  Yes Malva Limesonald E Fisher, MD  Cyanocobalamin (VITAMIN B-12 CR) 1000 MCG TBCR Take 1 tablet by mouth daily.   Yes Historical Provider, MD  glucose blood (CLEVER CHEK AUTO-CODE VOICE) test strip 1 strip. As directed   Yes Historical Provider, MD  Insulin Glargine (BASAGLAR KWIKPEN [place]) Inject 30 Units into the skin every morning. As directed 07/04/15  Yes Historical Provider, MD  Liraglutide (VICTOZA) 18 MG/3ML SOPN Inject 1.2 mg into the skin daily.   Yes Historical Provider, MD  metFORMIN (GLUCOPHAGE-XR) 500 MG 24 hr tablet TAKE 2 TABLETS (1,000 MG TOTAL) BY MOUTH TWICE DAILY 02/14/15  Yes Malva Limesonald E Fisher, MD  montelukast (SINGULAIR) 10 MG tablet TAKE 1 TABLET BY MOUTH ONCE DAILY 03/29/16  Yes Malva Limesonald E Fisher, MD  norethindrone-ethinyl estradiol (NECON 7/7/7) 0.5/0.75/1-35 MG-MCG tablet Take 1 tablet by mouth daily. 02/01/16  Yes Malva Limesonald E Fisher, MD  propranolol (INDERAL) 40 MG tablet TAKE 1 TABLET (40MG ) BY MOUTH ONCE DAILY AS NEEDED 01/13/16  Yes Malva Limesonald E Fisher, MD  VENTOLIN HFA 108 [phone](90 Base)  MCG/ACT inhaler INHALE 2 PUFFS BY MOUTH EVERY 6 HOURS AS NEEDED 09/23/15  Yes Malva Limesonald E Fisher, MD    Allergies as of 08/01/2016 - Review Complete 06/05/2016  Allergen Reaction Noted  . Aspirin  01/17/2015  . Enalapril maleate Cough 01/17/2015    Family History  Problem Relation Age of Onset  . Diabetes Mother   . Kidney disease Mother   . Heart failure Mother   . Diabetes Father   . Colon polyps Father [phone]  . Diabetes Sister   . Asthma Brother   . Ulcers Brother     Social History   Social History  . Marital status: Divorced    Spouse name: N/A  . Number of children: 1  . Years of education: College   Occupational History  . Employed     works at [employer]   Social History Main Topics  . Smoking status: Former Smoker    Packs/day: 0.50    Years: 20.00    Types: Cigarettes    Quit date: 06/25/2002  . Smokeless tobacco: Never Used  . Alcohol use No  . Drug use: No  . Sexual activity: Not on file   Other Topics Concern  . Not on file   Social History Narrative  . No narrative on file    Review of Systems: See HPI, otherwise negative ROS  Physical Exam: BP 132/88   Pulse 86   Temp 97.7 F (36.5 C) (Tympanic)  Resp 16   Ht 5\' 5"  (1.651 m)   Wt 257 lb (116.6 kg)   LMP 08/19/2016 Comment: Preg. Test Negative  SpO2 100%   BMI 42.77 kg/m  General:   Alert,  pleasant and cooperative in NAD Head:  Normocephalic and atraumatic. Neck:  Supple; no masses or thyromegaly. Lungs:  Clear throughout to auscultation.    Heart:  Regular rate and rhythm. Abdomen:  Soft, nontender and nondistended. Normal bowel sounds, without guarding, and without rebound.   Neurologic:  Alert and  oriented x4;  grossly normal neurologically.  Impression/Plan: Tara Byrd is now here to undergo a screening colonoscopy.  Risks, benefits, and alternatives regarding colonoscopy have been reviewed with the patient.  Questions have been answered.  All parties agreeable.

## 2016-09-06 NOTE — Anesthesia Postprocedure Evaluation (Signed)
Anesthesia Post Note  Patient: Tara Byrd  Procedure(s) Performed: Procedure(s) (LRB): COLONOSCOPY WITH PROPOFOL (N/A)  Patient location during evaluation: PACU Anesthesia Type: MAC Level of consciousness: awake and alert Pain management: pain level controlled Vital Signs Assessment: post-procedure vital signs reviewed and stable Respiratory status: spontaneous breathing, nonlabored ventilation, respiratory function stable and patient connected to nasal cannula oxygen Cardiovascular status: stable and blood pressure returned to baseline Anesthetic complications: no    Alisa Graff

## 2016-09-06 NOTE — Anesthesia Procedure Notes (Signed)
Procedure Name: MAC Performed by: Mayme Genta Pre-anesthesia Checklist: Patient identified, Emergency Drugs available, Suction available, Timeout performed and Patient being monitored Patient Re-evaluated:Patient Re-evaluated prior to inductionOxygen Delivery Method: Nasal cannula Placement Confirmation: positive ETCO2

## 2016-09-07 ENCOUNTER — Encounter: Payer: Self-pay | Admitting: Gastroenterology

## 2016-09-11 ENCOUNTER — Ambulatory Visit (INDEPENDENT_AMBULATORY_CARE_PROVIDER_SITE_OTHER): Payer: BC Managed Care – PPO | Admitting: Family Medicine

## 2016-09-11 ENCOUNTER — Encounter: Payer: Self-pay | Admitting: Family Medicine

## 2016-09-11 VITALS — BP 128/74 | Temp 98.7°F | Resp 16 | Wt 264.0 lb

## 2016-09-11 DIAGNOSIS — M25511 Pain in right shoulder: Secondary | ICD-10-CM

## 2016-09-11 DIAGNOSIS — M542 Cervicalgia: Secondary | ICD-10-CM | POA: Diagnosis not present

## 2016-09-11 MED ORDER — PREDNISONE 10 MG PO TABS: ORAL_TABLET | ORAL | 0 refills | Status: AC

## 2016-09-11 NOTE — Progress Notes (Signed)
Patient: Tara Byrd Female    DOB: 19-May-1966   50 y.o.   MRN: [id number] Visit Date: 09/11/2016  Today's Provider: Mila Merryonald Emalee Knies, MD   Chief Complaint  Patient presents with  . Shoulder Pain  . Neck Pain   Subjective:    HPI Patient presents  today c/o pain in her shoulder and neck. Patient was seen in the office on 06/05/2016 with similar symptoms. Patient was treated with a prednisone taper which she reports helped quite a bit, but never resolved completely. Pain is now as bad as it was in December.  She reports that it returned with a burning sensation down right arm into right forearm.. Patient reports that she has been taking Aleve and Tylenol with no relief. [facility] FINDINGS:  Cervicothoracic junction is not well visualized on lateral view.   There is no acute fracture or listhesis of the cervical spine. The anterior atlantodental space measures 3 mm.No osseous erosion. No prevertebral soft tissue swelling. Disc spaces are preserved. Vertebral body heights are normal limits. The partially visualized lung apices are clear.     Allergies  Allergen Reactions  . Almond (Diagnostic) Shortness Of Breath    Pt states "nuts" cause SOB  . Aspirin Hives  . Enalapril Maleate Cough  . Tomato Itching    Roma tomatoes only     Current Outpatient Prescriptions:  .  amitriptyline (ELAVIL) 10 MG tablet, Take 1-2 tablets by mouth every evening., Disp: , Rfl:  .  Amlodipine-Valsartan-HCTZ 5-160-12.5 MG TABS, Take 1 tablet by mouth daily., Disp: 90 tablet, Rfl: 3 .  Cyanocobalamin (VITAMIN B-12 CR) 1000 MCG TBCR, Take 1 tablet by mouth daily., Disp: , Rfl:  .  glucose blood (CLEVER CHEK AUTO-CODE VOICE) test strip, 1 strip. As directed, Disp: , Rfl:  .  Insulin Glargine (BASAGLAR KWIKPEN [place]), Inject 30 Units into the skin every morning. As directed, Disp: , Rfl:  .  Liraglutide (VICTOZA) 18 MG/3ML SOPN, Inject 1.2 mg into the skin daily.,  Disp: , Rfl:  .  metFORMIN (GLUCOPHAGE-XR) 500 MG 24 hr tablet, TAKE 2 TABLETS (1,000 MG TOTAL) BY MOUTH TWICE DAILY, Disp: 120 tablet, Rfl: 2 .  montelukast (SINGULAIR) 10 MG tablet, TAKE 1 TABLET BY MOUTH ONCE DAILY, Disp: 30 tablet, Rfl: 5 .  norethindrone-ethinyl estradiol (NECON 7/7/7) 0.5/0.75/1-35 MG-MCG tablet, Take 1 tablet by mouth daily., Disp: 28 tablet, Rfl: 10 .  propranolol (INDERAL) 40 MG tablet, TAKE 1 TABLET (40MG ) BY MOUTH ONCE DAILY AS NEEDED, Disp: 30 tablet, Rfl: 5 .  VENTOLIN HFA 108 (90 Base) MCG/ACT inhaler, INHALE 2 PUFFS BY MOUTH EVERY 6 HOURS AS NEEDED, Disp: 1 each, Rfl: 5  Review of Systems  Constitutional: Positive for activity change.  Musculoskeletal: Positive for myalgias, neck pain and neck stiffness.  Neurological: Positive for numbness and headaches.    Social History  Substance Use Topics  . Smoking status: Former Smoker    Packs/day: 0.50    Years: 20.00    Types: Cigarettes    Quit date: 06/25/2002  . Smokeless tobacco: Never Used  . Alcohol use No   Objective:   BP 128/74 (BP Location: Left Arm, Patient Position: Sitting, Cuff Size: Large)   Temp 98.7 F (37.1 C)   Resp 16   Wt 264 lb (119.7 kg)   LMP 08/19/2016 Comment: Preg. Test Negative  BMI 43.93 kg/m  Vitals:   09/11/16 1618  BP: 128/74  Resp:  16  Temp: 98.7 F (37.1 C)  Weight: 264 lb (119.7 kg)     Physical Exam   General Appearance:    Alert, cooperative, no distress  Eyes:    PERRL, conjunctiva/corneas clear, EOM's intact       Lungs:     Clear to auscultation bilaterally, respirations unlabored  Heart:    Regular rate and rhythm  Neurologic:   Awake, alert, oriented x 3. No apparent focal neurological           defect.   MS:   Diffused tenderness from mid c-spine to right upper trapezius, shoulder and right lateral arm. Pain with internal and external rotation of shoulder.        Assessment & Plan:     1. Neck pain on right side  - Ambulatory referral to  Physical Therapy - predniSONE (DELTASONE) 10 MG tablet; 6 tablets for 2 days, then 5 for 2 days, then 4 for 2 days, then 3 for 2 days, then 2 for 2 days, then 1 for 2 days.  Dispense: 42 tablet; Refill: 0  2. Acute pain of right shoulder Some components of bursitis and cervical radiculopathy.  - predniSONE (DELTASONE) 10 MG tablet; 6 tablets for 2 days, then 5 for 2 days, then 4 for 2 days, then 3 for 2 days, then 2 for 2 days, then 1 for 2 days.  Dispense: 42 tablet; Refill: 0  If not responded to conservative treatment will need cervical MRI.       [name], MD  [facility]

## 2016-09-21 LAB — HM MAMMOGRAPHY

## 2016-10-08 ENCOUNTER — Other Ambulatory Visit: Payer: Self-pay | Admitting: Family Medicine

## 2016-10-12 LAB — HEMOGLOBIN A1C: HEMOGLOBIN A1C: 8.6

## 2016-12-07 ENCOUNTER — Other Ambulatory Visit: Payer: Self-pay | Admitting: Family Medicine

## 2017-04-02 ENCOUNTER — Other Ambulatory Visit: Payer: Self-pay | Admitting: Family Medicine

## 2017-05-21 ENCOUNTER — Other Ambulatory Visit: Payer: Self-pay | Admitting: Family Medicine

## 2017-09-10 LAB — HM DIABETES EYE EXAM

## 2017-10-03 ENCOUNTER — Other Ambulatory Visit: Payer: Self-pay | Admitting: Family Medicine

## 2017-10-03 ENCOUNTER — Telehealth: Payer: Self-pay | Admitting: Family Medicine

## 2017-10-04 LAB — HM MAMMOGRAPHY

## 2017-10-10 ENCOUNTER — Encounter: Payer: Self-pay | Admitting: *Deleted

## 2017-10-22 ENCOUNTER — Encounter: Payer: BC Managed Care – PPO | Admitting: Family Medicine

## 2017-10-25 ENCOUNTER — Encounter: Payer: BC Managed Care – PPO | Admitting: Family Medicine

## 2017-11-11 ENCOUNTER — Other Ambulatory Visit: Payer: Self-pay | Admitting: Family Medicine

## 2018-01-22 ENCOUNTER — Other Ambulatory Visit: Payer: Self-pay | Admitting: *Deleted

## 2018-01-22 MED ORDER — ALBUTEROL SULFATE HFA 108 (90 BASE) MCG/ACT IN AERS: 2.0000 | INHALATION_SPRAY | Freq: Four times a day (QID) | RESPIRATORY_TRACT | 5 refills | Status: DC | PRN

## 2018-03-21 ENCOUNTER — Encounter: Payer: Self-pay | Admitting: Family Medicine

## 2018-03-21 ENCOUNTER — Ambulatory Visit (INDEPENDENT_AMBULATORY_CARE_PROVIDER_SITE_OTHER): Payer: BC Managed Care – PPO | Admitting: Family Medicine

## 2018-03-21 VITALS — BP 104/72 | HR 85 | Temp 98.0°F | Resp 16 | Ht 65.0 in | Wt 231.0 lb

## 2018-03-21 DIAGNOSIS — Z9989 Dependence on other enabling machines and devices: Secondary | ICD-10-CM

## 2018-03-21 DIAGNOSIS — E11649 Type 2 diabetes mellitus with hypoglycemia without coma: Secondary | ICD-10-CM

## 2018-03-21 DIAGNOSIS — N951 Menopausal and female climacteric states: Secondary | ICD-10-CM

## 2018-03-21 DIAGNOSIS — G4733 Obstructive sleep apnea (adult) (pediatric): Secondary | ICD-10-CM

## 2018-03-21 DIAGNOSIS — Z23 Encounter for immunization: Secondary | ICD-10-CM | POA: Diagnosis not present

## 2018-03-21 DIAGNOSIS — Z0000 Encounter for general adult medical examination without abnormal findings: Secondary | ICD-10-CM

## 2018-03-21 DIAGNOSIS — I10 Essential (primary) hypertension: Secondary | ICD-10-CM

## 2018-03-21 DIAGNOSIS — G43809 Other migraine, not intractable, without status migrainosus: Secondary | ICD-10-CM

## 2018-03-21 LAB — POCT UA - MICROALBUMIN: Microalbumin Ur, POC: 20 mg/L

## 2018-03-21 MED ORDER — AMITRIPTYLINE HCL 10 MG PO TABS: 10.0000 mg | ORAL_TABLET | Freq: Every evening | ORAL | 5 refills | Status: DC

## 2018-03-21 NOTE — Progress Notes (Signed)
Patient: Tara Byrd, Female    DOB: 11-05-1965, 52 y.o.   MRN: [id number] Visit Date: 03/21/2018  Today's Provider: [name], MD   Chief Complaint  Patient presents with  . Annual Exam  . Hypertension   Subjective:    Annual physical exam Tara Byrd is a 52 y.o. female who presents today for health maintenance and complete physical. She feels well. She reports exercising yes. She reports she is sleeping well with CPAP and using every night. She stopped her OCPs 3 months ago and states is no longer having periods.   ---------------------------------------------------------------   Hypertension, follow-up:  BP Readings from Last 3 Encounters:  03/21/18 104/72  09/11/16 128/74  09/06/16 108/72    She was last seen for hypertension 09/13/2015.  BP at that visit was 138/68. Management since that visit includes; no changes.She reports good compliance with treatment. She is not having side effects. none She is exercising. She is adherent to low salt diet.   Outside blood pressures are 110/70. She is experiencing none.  Patient denies none.   Cardiovascular risk factors include diabetes mellitus.  Use of agents associated with hypertension: none.   ---------------------------------------------------------------   Type 2 diabetes mellitus without complication, with long-term current use of insulin (HCC) From 09/13/2015-no changes. Continue regular follow up endocrinology with Dr. Gayland Curry. Has a1c was 6.1% lipids and metabolic panel are monitored by endo. She [facility].   Wt Readings from Last 5 Encounters:  03/21/18 231 lb (104.8 kg)  09/11/16 264 lb (119.7 kg)  09/06/16 257 lb (116.6 kg)  06/05/16 261 lb (118.4 kg)  09/13/15 262 lb (118.8 kg)    Review of Systems  Constitutional: Negative for chills, diaphoresis and fever.  HENT: Negative for congestion, ear discharge, ear pain, hearing loss, nosebleeds, sore throat and  tinnitus.   Eyes: Negative for photophobia, pain, discharge and redness.  Respiratory: Positive for apnea. Negative for cough, shortness of breath, wheezing and stridor.   Cardiovascular: Negative for chest pain, palpitations and leg swelling.  Gastrointestinal: Negative for abdominal pain, blood in stool, constipation, diarrhea, nausea and vomiting.  Endocrine: Negative for polydipsia.  Genitourinary: Negative for dysuria, flank pain, frequency, hematuria and urgency.  Musculoskeletal: Negative for back pain, myalgias and neck pain.  Skin: Negative for rash.  Allergic/Immunologic: Positive for environmental allergies and food allergies.  Neurological: Negative for dizziness, tremors, seizures, weakness and headaches.  Hematological: Does not bruise/bleed easily.  Psychiatric/Behavioral: Negative for hallucinations and suicidal ideas. The patient is not nervous/anxious.     Social History      She  reports that she quit smoking about 15 years ago. Her smoking use included cigarettes. She has a 10.00 pack-year smoking history. She has never used smokeless tobacco. She reports that she does not drink alcohol or use drugs.       Social History   Socioeconomic History  . Marital status: Divorced    Spouse name: Not on file  . Number of children: 1  . Years of education: College  . Highest education level: Not on file  Occupational History  . Occupation: Employed    Comment: works at [employer]  . Financial resource strain: Not on file  . Food insecurity:    Worry: Not on file    Inability: Not on file  . Transportation needs:    Medical: Not on file    Non-medical: Not on file  Tobacco Use  . Smoking  status: Former Smoker    Packs/day: 0.50    Years: 20.00    Pack years: 10.00    Types: Cigarettes    Last attempt to quit: 06/25/2002    Years since quitting: 15.7  . Smokeless tobacco: Never Used  Substance and Sexual Activity  . Alcohol use: No    Alcohol/week: 0.0  standard drinks  . Drug use: No  . Sexual activity: Not on file  Lifestyle  . Physical activity:    Days per week: Not on file    Minutes per session: Not on file  . Stress: Not on file  Relationships  . Social connections:    Talks on phone: Not on file    Gets together: Not on file    Attends religious service: Not on file    Active member of club or organization: Not on file    Attends meetings of clubs or organizations: Not on file    Relationship status: Not on file  Other Topics Concern  . Not on file  Social History Narrative  . Not on file    Past Medical History:  Diagnosis Date  . Anemia   . Anxiety   . Asthma   . Diabetes mellitus without complication (HCC)   . Hypertension   . Sleep apnea    CPAP - only uses "sometimes"     Patient Active Problem List   Diagnosis Date Noted  . Special screening for malignant neoplasms, colon   . Menopausal symptoms 09/13/2015  . Absence of bladder continence 02/11/2015  . Back pain 01/17/2015  . Contraception 01/17/2015  . Arthralgia of hip 01/17/2015  . Obstructive sleep apnea 01/17/2015  . Albuminuria 12/14/2013  . Vitamin B12 deficiency anemia 10/14/2009  . Diabetes mellitus type 2, uncontrolled (HCC) 06/29/2009  . Rotator cuff syndrome 02/13/2009  . Chronic nonalcoholic liver disease 11/14/2007  . Contact dermatitis 10/30/2004  . History of tobacco use 06/25/2004  . Edema 10/22/2003  . Obesity 10/22/2003  . Symptomatic premature ventricular contractions 08/27/2003  . History of MI (myocardial infarction) 06/25/2002  . Asthma without status asthmaticus 04/14/2002  . Allergic rhinitis 03/20/2002  . Anxiety disorder 03/11/2002  . Headache, migraine 12/18/2001  . Essential (primary) hypertension 10/30/1999    Past Surgical History:  Procedure Laterality Date  . ABLATION  2004   Cardiac  . CESAREAN SECTION  1986  . COLONOSCOPY WITH PROPOFOL N/A 09/06/2016   Procedure: COLONOSCOPY WITH PROPOFOL;  Surgeon:  Midge Minium, MD;  Location: [facility];  Service: Endoscopy;  Laterality: N/A;  Diabetic - insulin sleep apnea    Family History        Family Status  Relation Name Status  . Mother  Deceased at age 71       Cause of death: Renal Failure  . Father  Alive  . Sister  Alive  . Brother  Alive        Her family history includes Asthma in her brother; Colon polyps (age of onset: 47) in her father; Diabetes in her father, mother, and sister; Heart failure in her mother; Kidney disease in her mother; Ulcers in her brother.      Allergies  Allergen Reactions  . Almond (Diagnostic) Shortness Of Breath    Pt states "nuts" cause SOB  . Aspirin Hives  . Enalapril Maleate Cough  . Tomato Itching    Roma tomatoes only     Current Outpatient Medications:  .  albuterol (VENTOLIN HFA) 108 (90 Base) MCG/ACT  inhaler, Inhale 2 puffs into the lungs every 6 (six) hours as needed., Disp: 1 each, Rfl: 5 .  amitriptyline (ELAVIL) 10 MG tablet, Take 1-2 tablets by mouth every evening., Disp: , Rfl:  .  amLODIPine-Valsartan-HCTZ 5-160-12.5 MG TABS, TAKE 1 TABLET BY MOUTH ONCE DAILY, Disp: 60 tablet, Rfl: 5 .  Cyanocobalamin (VITAMIN B-12 CR) 1000 MCG TBCR, Take 1 tablet by mouth daily., Disp: , Rfl:  .  glucose blood (CLEVER CHEK AUTO-CODE VOICE) test strip, 1 strip. As directed, Disp: , Rfl:  .  Insulin Glargine (BASAGLAR KWIKPEN [place]), Inject 30 Units into the skin every morning. As directed, Disp: , Rfl:  .  metFORMIN (GLUCOPHAGE-XR) 500 MG 24 hr tablet, TAKE 2 TABLETS (1,000 MG TOTAL) BY MOUTH TWICE DAILY (Patient taking differently: 500 mg 2 (two) times daily. ), Disp: 120 tablet, Rfl: 2 .  montelukast (SINGULAIR) 10 MG tablet, TAKE 1 TABLET BY MOUTH ONCE DAILY, Disp: 30 tablet, Rfl: 5 .  propranolol (INDERAL) 40 MG tablet, TAKE 1 TABLET (40MG ) BY MOUTH ONCE DAILY AS NEEDED, Disp: 30 tablet, Rfl: 5 .  Semaglutide, 1 MG/DOSE, 2 MG/1.5ML SOPN, INJECT 1 MG SUBCUTANEOUSLY (UNDER SKIN) EACH WEEK,  Disp: , Rfl:  .  Liraglutide (VICTOZA) 18 MG/3ML SOPN, Inject 1.2 mg into the skin daily., Disp: , Rfl:  .  NORTREL 7/7/7 0.5/0.75/1-35 MG-MCG tablet, TAKE 1 TABLET BY MOUTH ONCE DAILY (Patient not taking: Reported on 03/21/2018), Disp: 28 tablet, Rfl: 0   Patient Care Team: Malva Limes, MD as PCP - General (Family Medicine) Venetia Night, MD as Referring Physician (Endocrinology) System, Provider Not In Raess, Kristi (Optometry)      Objective:   Vitals: BP 104/72 (BP Location: Right Arm, Patient Position: Sitting, Cuff Size: Large)   Pulse 85   Temp 98 F (36.7 C) (Oral)   Resp 16   Ht 5\' 5"  (1.651 m)   Wt 231 lb (104.8 kg)   SpO2 97%   BMI 38.44 kg/m    Vitals:   03/21/18 1016  BP: 104/72  Pulse: 85  Resp: 16  Temp: 98 F (36.7 C)  TempSrc: Oral  SpO2: 97%  Weight: 231 lb (104.8 kg)  Height: 5\' 5"  (1.651 m)     Physical Exam   General Appearance:    Alert, cooperative, no distress, appears stated age, obese  Head:    Normocephalic, without obvious abnormality, atraumatic  Eyes:    PERRL, conjunctiva/corneas clear, EOM's intact, fundi    benign, both eyes  Ears:    Normal TM's and external ear canals, both ears  Nose:   Nares normal, septum midline, mucosa normal, no drainage    or sinus tenderness  Throat:   Lips, mucosa, and tongue normal; teeth and gums normal  Neck:   Supple, symmetrical, trachea midline, no adenopathy;    thyroid:  no enlargement/tenderness/nodules; no carotid   bruit or JVD  Back:     Symmetric, no curvature, ROM normal, no CVA tenderness  Lungs:     Clear to auscultation bilaterally, respirations unlabored  Chest Wall:    No tenderness or deformity   Heart:    Regular rate and rhythm, S1 and S2 normal, no murmur, rub   or gallop  Breast Exam:    normal appearance, no masses or tenderness, deferred  Abdomen:     Soft, non-tender, bowel sounds active all four quadrants,    no masses, no organomegaly  Pelvic:    deferred    Extremities:  Extremities normal, atraumatic, no cyanosis or edema  Pulses:   2+ and symmetric all extremities  Skin:   Skin color, texture, turgor normal, no rashes or lesions  Lymph nodes:   Cervical, supraclavicular, and axillary nodes normal  Neurologic:   CNII-XII intact, normal strength, sensation and reflexes    throughout    Depression Screen PHQ 2/9 Scores 09/13/2015  PHQ - 2 Score 0  PHQ- 9 Score 1      Assessment & Plan:     Routine Health Maintenance and Physical Exam  Exercise Activities and Dietary recommendations Goals   None     Immunization History  Administered Date(s) Administered  . Influenza-Unspecified 02/25/2017  . Pneumococcal Polysaccharide-23 07/25/2012  . Tdap 06/25/2006    Health Maintenance  Topic Date Due  . FOOT EXAM  05/15/1976  . HIV Screening  05/15/1981  . TETANUS/TDAP  06/25/2016  . HEMOGLOBIN A1C  04/13/2017  . INFLUENZA VACCINE  01/23/2018  . OPHTHALMOLOGY EXAM  09/11/2018  . PAP SMEAR  09/13/2018  . MAMMOGRAM  10/05/2018  . COLONOSCOPY  09/07/2026  . PNEUMOCOCCAL POLYSACCHARIDE VACCINE AGE 20-64 HIGH RISK  Completed     Discussed health benefits of physical activity, and encouraged her to engage in regular exercise appropriate for her age and condition.    --------------------------------------------------------------------  1. Annual physical exam  - EKG 12-Lead  2. Controlled type 2 diabetes mellitus  Well controlled.  Continue routine follow up endocrinology.   3. Perimenopausal Off OCP x 3 months with no menstrual cycle.   4. Need for shingles vaccine  - Varicella-zoster vaccine IM (Shingrix)  5. Need for Td vaccine  - Td vaccine greater than or equal to 7yo preservative free IM  6. Essential (primary) hypertension Well controlled.  Continue current medications.    7. Other migraine without status migrainosus, not intractable Doing well with amitriptyline which she takes a few times each week.    8. OSA on CPAP Compliant with and medically benefiting from CPAP use.    [name], MD  [facility]

## 2018-03-31 ENCOUNTER — Other Ambulatory Visit: Payer: Self-pay | Admitting: Family Medicine

## 2018-05-30 ENCOUNTER — Ambulatory Visit (INDEPENDENT_AMBULATORY_CARE_PROVIDER_SITE_OTHER): Payer: BC Managed Care – PPO | Admitting: Family Medicine

## 2018-05-30 DIAGNOSIS — Z23 Encounter for immunization: Secondary | ICD-10-CM

## 2018-06-03 NOTE — Progress Notes (Signed)
Vaccine administration only. No E&M service today.   

## 2018-06-13 LAB — HEMOGLOBIN A1C: Hemoglobin A1C: 6.2

## 2018-07-04 ENCOUNTER — Encounter: Payer: Self-pay | Admitting: Family Medicine

## 2018-07-04 NOTE — Telephone Encounter (Signed)
This has been abstracted.   Thanks,   -Vernona Rieger

## 2018-07-12 NOTE — Telephone Encounter (Signed)
encounter opened in error, closed for administrative reasons.

## 2018-09-26 ENCOUNTER — Other Ambulatory Visit: Payer: Self-pay | Admitting: Family Medicine

## 2018-10-22 ENCOUNTER — Other Ambulatory Visit: Payer: Self-pay | Admitting: Family Medicine

## 2018-10-22 NOTE — Telephone Encounter (Signed)
Please advise. This medication has not been refilled since 01/13/2016.   She also wanted advise on symptoms of abdominal pain after eating and bloating. I was going to advise her that she needs an office visit for evaluation, unless you suggest something else. Please advise.

## 2018-10-22 NOTE — Telephone Encounter (Signed)
Pt needs refill on  Propranolol 40 mg  [facility]  CB#  [phone]  Pt also states after she eats that she is having pain and bloating  Please call her back about this  Thanks teri

## 2018-10-23 MED ORDER — PROPRANOLOL HCL 40 MG PO TABS: ORAL_TABLET | ORAL | 5 refills | Status: DC

## 2018-10-23 NOTE — Telephone Encounter (Signed)
She should make an appointment to evaluate abdominal pain

## 2018-12-01 ENCOUNTER — Telehealth: Payer: Self-pay | Admitting: Family Medicine

## 2018-12-01 NOTE — Telephone Encounter (Signed)
Pt wants to know if Dr. Caryn Section will prescribe her Linzess.  She has used this before and it works for her  CB#  [phone]

## 2018-12-01 NOTE — Telephone Encounter (Signed)
Left message for pt to call back and schedule an appointment.  Please schedule when pt calls back.   Thanks,   -Mickel Baas

## 2018-12-01 NOTE — Telephone Encounter (Signed)
Patient advised as below.  

## 2018-12-01 NOTE — Telephone Encounter (Signed)
I've never prescribed this for her before. Need more info. Probably needs office visit.

## 2018-12-12 ENCOUNTER — Ambulatory Visit: Payer: Self-pay | Admitting: Family Medicine

## 2018-12-16 LAB — HM DIABETES EYE EXAM

## 2018-12-30 ENCOUNTER — Encounter: Payer: Self-pay | Admitting: Family Medicine

## 2019-02-10 LAB — HM MAMMOGRAPHY

## 2019-02-11 ENCOUNTER — Encounter: Payer: Self-pay | Admitting: Family Medicine

## 2019-03-30 ENCOUNTER — Other Ambulatory Visit: Payer: Self-pay | Admitting: Family Medicine

## 2019-03-31 ENCOUNTER — Ambulatory Visit: Payer: BC Managed Care – PPO | Admitting: Family Medicine

## 2019-05-29 LAB — BASIC METABOLIC PANEL
BUN: 27 — AB (ref 4–21)
Creatinine: 1.1 (ref 0.5–1.1)
Potassium: 4.8 (ref 3.4–5.3)

## 2019-05-29 LAB — LIPID PANEL
Cholesterol: 183 (ref 0–200)
HDL: 40 (ref 35–70)
LDL Cholesterol: 100
Triglycerides: 217 — AB (ref 40–160)

## 2019-05-29 LAB — HEMOGLOBIN A1C: Hemoglobin A1C: 6.7

## 2019-05-29 LAB — HEPATIC FUNCTION PANEL: ALT: 18 (ref 7–35)

## 2019-05-29 LAB — VITAMIN D 25 HYDROXY (VIT D DEFICIENCY, FRACTURES): Vit D, 25-Hydroxy: 8.8

## 2019-05-29 LAB — TSH: TSH: 1.81 (ref 0.41–5.90)

## 2019-06-08 DIAGNOSIS — E559 Vitamin D deficiency, unspecified: Secondary | ICD-10-CM | POA: Insufficient documentation

## 2019-06-17 ENCOUNTER — Ambulatory Visit: Payer: BC Managed Care – PPO | Admitting: Family Medicine

## 2019-06-29 ENCOUNTER — Other Ambulatory Visit: Payer: Self-pay | Admitting: Family Medicine

## 2019-06-29 NOTE — Telephone Encounter (Signed)
Requested medication (s) are due for refill today: no  Requested medication (s) are on the active medication list: yes  Last refill:  12/30/2018  Future visit scheduled: yes  Notes to clinic:  no valid encounter within last 12 months   Requested Prescriptions  Pending Prescriptions Disp Refills   albuterol (VENTOLIN HFA) 108 (90 Base) MCG/ACT inhaler [Pharmacy Med Name: albuterol HFA 90 mcg/actuation inhaler] 18 g 5    Sig: INHALE 2 PUFFS INTO THE LUNGS EVERY 6 HOURS AS NEEDED      Pulmonology:  Beta Agonists Failed - 06/29/2019  7:36 AM      Failed - One inhaler should last at least one month. If the patient is requesting refills earlier, contact the patient to check for uncontrolled symptoms.      Failed - Valid encounter within last 12 months    Recent Outpatient Visits           1 year ago Need for shingles vaccine   [facility] Malva Limes, MD   1 year ago Annual physical exam   [facility] Malva Limes, MD   2 years ago Neck pain on right side   [facility] Malva Limes, MD   3 years ago Acute neck pain   [facility] Malva Limes, MD   3 years ago Menopausal symptoms   [facility] Sherrie Mustache, Demetrios Isaacs, MD

## 2019-07-27 ENCOUNTER — Encounter: Payer: BC Managed Care – PPO | Admitting: Family Medicine

## 2019-07-29 ENCOUNTER — Encounter: Payer: Self-pay | Admitting: Family Medicine

## 2019-08-17 MED ORDER — AMLODIPINE BESYLATE 5 MG PO TABS: 5.0000 mg | ORAL_TABLET | Freq: Every day | ORAL | 0 refills | Status: DC

## 2019-08-17 MED ORDER — AMLODIPINE BESYLATE 10 MG PO TABS: 10.0000 mg | ORAL_TABLET | Freq: Every day | ORAL | 0 refills | Status: DC

## 2019-08-17 MED ORDER — VALSARTAN-HYDROCHLOROTHIAZIDE 160-12.5 MG PO TABS: 1.0000 | ORAL_TABLET | Freq: Every day | ORAL | 0 refills | Status: DC

## 2019-08-28 ENCOUNTER — Other Ambulatory Visit: Payer: Self-pay | Admitting: Family Medicine

## 2019-08-28 ENCOUNTER — Encounter: Payer: BC Managed Care – PPO | Admitting: Family Medicine

## 2019-08-28 NOTE — Telephone Encounter (Signed)
Requested Prescriptions  Pending Prescriptions Disp Refills  . albuterol (VENTOLIN HFA) 108 (90 Base) MCG/ACT inhaler [Pharmacy Med Name: albuterol HFA 90 mcg/actuation inhaler] 18 g 5    Sig: INHALE 2 PUFFS INTO THE LUNGS EVERY 6 HOURS AS NEEDED     Pulmonology:  Beta Agonists Failed - 08/28/2019  7:35 AM      Failed - One inhaler should last at least one month. If the patient is requesting refills earlier, contact the patient to check for uncontrolled symptoms.      Failed - Valid encounter within last 12 months    Recent Outpatient Visits          1 year ago Need for shingles vaccine   [facility] Malva Limes, MD   1 year ago Annual physical exam   [facility] Malva Limes, MD   2 years ago Neck pain on right side   [facility] Malva Limes, MD   3 years ago Acute neck pain   [facility] Malva Limes, MD   3 years ago Menopausal symptoms   [facility], Demetrios Isaacs, MD      Future Appointments            In 3 days Fisher, Demetrios Isaacs, MD [facility]

## 2019-08-31 ENCOUNTER — Other Ambulatory Visit: Payer: Self-pay

## 2019-08-31 ENCOUNTER — Ambulatory Visit (INDEPENDENT_AMBULATORY_CARE_PROVIDER_SITE_OTHER): Payer: BC Managed Care – PPO | Admitting: Family Medicine

## 2019-08-31 ENCOUNTER — Encounter: Payer: Self-pay | Admitting: Family Medicine

## 2019-08-31 VITALS — BP 106/73 | HR 87 | Temp 97.3°F | Ht 65.0 in | Wt 263.6 lb

## 2019-08-31 DIAGNOSIS — E119 Type 2 diabetes mellitus without complications: Secondary | ICD-10-CM

## 2019-08-31 DIAGNOSIS — K5909 Other constipation: Secondary | ICD-10-CM

## 2019-08-31 DIAGNOSIS — Z0000 Encounter for general adult medical examination without abnormal findings: Secondary | ICD-10-CM

## 2019-08-31 DIAGNOSIS — I10 Essential (primary) hypertension: Secondary | ICD-10-CM | POA: Diagnosis not present

## 2019-08-31 DIAGNOSIS — G4733 Obstructive sleep apnea (adult) (pediatric): Secondary | ICD-10-CM | POA: Diagnosis not present

## 2019-08-31 DIAGNOSIS — F419 Anxiety disorder, unspecified: Secondary | ICD-10-CM

## 2019-08-31 DIAGNOSIS — Z9989 Dependence on other enabling machines and devices: Secondary | ICD-10-CM

## 2019-08-31 DIAGNOSIS — E559 Vitamin D deficiency, unspecified: Secondary | ICD-10-CM

## 2019-08-31 NOTE — Progress Notes (Signed)
Patient: Tara Byrd, Female    DOB: 09/20/1965, 54 y.o.   MRN: [id number] Visit Date: 08/31/2019  Today's Provider: [name], MD   Chief Complaint  Patient presents with  . Annual Exam  . Hypertension  . Diabetes   Subjective:     Annual physical exam Tara Byrd is a 54 y.o. female who presents today for health maintenance and complete physical. She feels well. She reports exercising walking daily. She reports she is sleeping poorly.  -----------------------------------------------------------------  Hypertension, follow-up:     BP Readings from Last 3 Encounters:  03/21/18 104/72  09/11/16 128/74  09/06/16 108/72    She was last seen for hypertension 03/21/2018 BP at that visit was 104/72. Management since that visit includes no changes. She reports good compliance with treatment. She is not having side effects. none She is exercising. She is adherent to low salt diet.   Outside blood pressures are not being checked at home. She is experiencing none.  Patient denies none.   Cardiovascular risk factors include diabetes mellitus.  Use of agents associated with hypertension: none.     ---------------------------------------------------------------  Type 2 diabetes mellitus without complication, with long-term current use of insulin (HCC) From 03/21/2018 Patient is followed by endocrinology with Dr. Gayland Curry.   Lab Results  Component Value Date   HGBA1C 6.7 05/29/2019   Her only complaint today is longstanding history of constipation that has worsened over the last several months. She is no on Ozempic prescribed by her endocrinologist and thinks maybe constipation has worsened since then. Has tried OTC stool softeners which did not help and OTC Miralax which just causes bloating and gassiness.    Review of Systems  Constitutional: Negative.   HENT: Negative.   Eyes: Negative.   Respiratory: Negative.   Cardiovascular: Negative.     Gastrointestinal: Negative.   Endocrine: Negative.   Genitourinary: Negative.   Musculoskeletal: Negative.   Skin: Negative.   Allergic/Immunologic: Negative.   Neurological: Negative.   Hematological: Negative.   Psychiatric/Behavioral: Negative.     Social History      She  reports that she quit smoking about 17 years ago. Her smoking use included cigarettes. She has a 10.00 pack-year smoking history. She has never used smokeless tobacco. She reports that she does not drink alcohol or use drugs.       Social History   Socioeconomic History  . Marital status: Divorced    Spouse name: Not on file  . Number of children: 1  . Years of education: College  . Highest education level: Not on file  Occupational History  . Occupation: Employed    Comment: works at [employer]  . Smoking status: Former Smoker    Packs/day: 0.50    Years: 20.00    Pack years: 10.00    Types: Cigarettes    Quit date: 06/25/2002    Years since quitting: 17.1  . Smokeless tobacco: Never Used  Substance and Sexual Activity  . Alcohol use: No    Alcohol/week: 0.0 standard drinks  . Drug use: No  . Sexual activity: Not on file  Other Topics Concern  . Not on file  Social History Narrative  . Not on file   Social Determinants of Health   Financial Resource Strain:   . Difficulty of Paying Living Expenses: Not on file  Food Insecurity:   . Worried About Programme researcher, broadcasting/film/video in the Last Year: Not on file  .  Ran Out of Food in the Last Year: Not on file  Transportation Needs:   . Lack of Transportation (Medical): Not on file  . Lack of Transportation (Non-Medical): Not on file  Physical Activity:   . Days of Exercise per Week: Not on file  . Minutes of Exercise per Session: Not on file  Stress:   . Feeling of Stress : Not on file  Social Connections:   . Frequency of Communication with Friends and Family: Not on file  . Frequency of Social Gatherings with Friends and Family: Not on file   . Attends Religious Services: Not on file  . Active Member of Clubs or Organizations: Not on file  . Attends Banker Meetings: Not on file  . Marital Status: Not on file    Past Medical History:  Diagnosis Date  . Anemia   . Anxiety   . Asthma   . Diabetes mellitus without complication (HCC)   . Hypertension   . Sleep apnea    CPAP - only uses "sometimes"     Patient Active Problem List   Diagnosis Date Noted  . Vitamin D deficiency 06/08/2019  . Menopausal symptoms 09/13/2015  . Absence of bladder continence 02/11/2015  . Back pain 01/17/2015  . Arthralgia of hip 01/17/2015  . OSA on CPAP 01/17/2015  . Albuminuria 12/14/2013  . Vitamin B12 deficiency anemia 10/14/2009  . Diabetes mellitus without complication (HCC) 06/29/2009  . Rotator cuff syndrome 02/13/2009  . Chronic nonalcoholic liver disease 11/14/2007  . History of tobacco use 06/25/2004  . Edema 10/22/2003  . Morbid obesity (HCC) 10/22/2003  . Symptomatic premature ventricular contractions 08/27/2003  . History of MI (myocardial infarction) 06/25/2002  . Asthma without status asthmaticus 04/14/2002  . Allergic rhinitis 03/20/2002  . Anxiety disorder 03/11/2002  . Headache, migraine 12/18/2001  . Essential (primary) hypertension 10/30/1999    Past Surgical History:  Procedure Laterality Date  . ABLATION  2004   Cardiac  . CESAREAN SECTION  1986  . COLONOSCOPY WITH PROPOFOL N/A 09/06/2016   Procedure: COLONOSCOPY WITH PROPOFOL;  Surgeon: Midge Minium, MD;  [facility];  Service: Endoscopy;  Laterality: N/A;  Diabetic - insulin sleep apnea    Family History        Family Status  Relation Name Status  . Mother  Deceased at age 76       Cause of death: Renal Failure  . Father  Alive  . Sister  Alive  . Brother  Alive        Her family history includes Asthma in her brother; Colon polyps (age of onset: 25) in her father; Diabetes in her father, mother, and sister; Heart  failure in her mother; Kidney disease in her mother; Ulcers in her brother.      Allergies  Allergen Reactions  . Almond (Diagnostic) Shortness Of Breath    Pt states "nuts" cause SOB  . Justicia Adhatoda (Malabar Nut Tree)  [Justicia Adhatoda] Shortness Of Breath  . Tomato Itching and Hives    Roma tomatoes only  . Aspirin Hives  . Chocolate Hives and Itching  . Enalapril Maleate Cough  . Other     Perfume     Current Outpatient Medications:  .  albuterol (VENTOLIN HFA) 108 (90 Base) MCG/ACT inhaler, INHALE 2 PUFFS INTO THE LUNGS EVERY 6 HOURS AS NEEDED, Disp: 18 g, Rfl: 0 .  amitriptyline (ELAVIL) 10 MG tablet, Take 1-2 tablets (10-20 mg total) by mouth every evening.,  Disp: 60 tablet, Rfl: 5 .  amLODipine (NORVASC) 5 MG tablet, Take 1 tablet (5 mg total) by mouth daily. PLEASE DISREGARD PREVIOUS PRESCRIPTION FOR 10MG  TABLETS. REPLACES AMLODIPINE-VALSARTAN-HCTZ, Disp: 90 tablet, Rfl: 0 .  atorvastatin (LIPITOR) 20 MG tablet, Take 20 mg by mouth daily., Disp: , Rfl:  .  Cyanocobalamin (VITAMIN B-12 CR) 1000 MCG TBCR, Take 1 tablet by mouth daily., Disp: , Rfl:  .  glucose blood (CLEVER CHEK AUTO-CODE VOICE) test strip, 1 strip. As directed, Disp: , Rfl:  .  Insulin Glargine (BASAGLAR KWIKPEN [place]), Inject 30 Units into the skin every morning. As directed, Disp: , Rfl:  .  metFORMIN (GLUCOPHAGE-XR) 500 MG 24 hr tablet, TAKE 2 TABLETS (1,000 MG TOTAL) BY MOUTH TWICE DAILY (Patient taking differently: 500 mg 2 (two) times daily. ), Disp: 120 tablet, Rfl: 2 .  montelukast (SINGULAIR) 10 MG tablet, Take 1 tablet by mouth once daily., Disp: 30 tablet, Rfl: 5 .  OZEMPIC, 1 MG/DOSE, 2 MG/1.5ML SOPN, , Disp: , Rfl:  .  propranolol (INDERAL) 40 MG tablet, TAKE 1 TABLET (40MG ) BY MOUTH ONCE DAILY AS NEEDED, Disp: 30 tablet, Rfl: 5 .  Semaglutide, 1 MG/DOSE, 2 MG/1.5ML SOPN, Inject 1 mg subcutaneously (under skin) each week., Disp: , Rfl:  .  valsartan-hydrochlorothiazide (DIOVAN HCT) 160-12.5 MG  tablet, Take 1 tablet by mouth daily., Disp: 90 tablet, Rfl: 0   Patient Care Team: Birdie Sons, MD as PCP - General (Family Medicine) Nelma Rothman, MD as Referring Physician (Endocrinology) System, Provider Not In [place], Kristi (Optometry)    Objective:    Vitals: BP 106/73 (BP Location: Right Arm, Patient Position: Sitting, Cuff Size: Normal)   Pulse 87   Temp (!) 97.3 F (36.3 C) (Temporal)   Ht 5\' 5"  (1.651 m)   Wt 263 lb 9.6 oz (119.6 kg)   BMI 43.87 kg/m    Vitals:   08/31/19 1353  BP: 106/73  Pulse: 87  Temp: (!) 97.3 F (36.3 C)  TempSrc: Temporal  Weight: 263 lb 9.6 oz (119.6 kg)  Height: 5\' 5"  (1.651 m)     Physical Exam   General Appearance:    Severely obese female. Alert, cooperative, in no acute distress, appears stated age   Head:    Normocephalic, without obvious abnormality, atraumatic  Eyes:    PERRL, conjunctiva/corneas clear, EOM's intact, fundi    benign, both eyes  Ears:    Normal TM's and external ear canals, both ears  Nose:   Nares normal, septum midline, mucosa normal, no drainage    or sinus tenderness  Throat:   Lips, mucosa, and tongue normal; teeth and gums normal  Neck:   Supple, symmetrical, trachea midline, no adenopathy;    thyroid:  no enlargement/tenderness/nodules; no carotid   bruit or JVD  Back:     Symmetric, no curvature, ROM normal, no CVA tenderness  Lungs:     Clear to auscultation bilaterally, respirations unlabored  Chest Wall:    No tenderness or deformity   Heart:    Normal heart rate. Normal rhythm. No murmurs, rubs, or gallops.   Breast Exam:    patient declined, deferred  Abdomen:     Soft, non-tender, bowel sounds active all four quadrants,    no masses, no organomegaly  Pelvic:    deferred  Extremities:   All extremities are intact. No cyanosis or edema  Pulses:   2+ and symmetric all extremities  Skin:   Skin color, texture, turgor normal, no  rashes or lesions  Lymph nodes:   Cervical, supraclavicular,  and axillary nodes normal  Neurologic:   CNII-XII intact, normal strength, sensation and reflexes    throughout    Depression Screen PHQ 2/9 Scores 08/31/2019 03/21/2018 09/13/2015  PHQ - 2 Score 0 0 0  PHQ- 9 Score 4 0 1       Assessment & Plan:     Routine Health Maintenance and Physical Exam  Exercise Activities and Dietary recommendations Goals   None     Immunization History  Administered Date(s) Administered  . Influenza-Unspecified 02/25/2017, 02/28/2018, 02/25/2019  . PFIZER SARS-COV-2 Vaccination 07/22/2019, 08/12/2019  . Pneumococcal Polysaccharide-23 07/25/2012  . Td 03/21/2018  . Tdap 06/25/2006  . Zoster Recombinat (Shingrix) 03/21/2018, 05/30/2018    Health Maintenance  Topic Date Due  . FOOT EXAM  05/15/1976  . HIV Screening  05/15/1981  . HEMOGLOBIN A1C  11/27/2019  . OPHTHALMOLOGY EXAM  12/16/2019  . MAMMOGRAM  02/10/2020  . PAP SMEAR-Modifier  09/12/2020  . COLONOSCOPY  09/07/2026  . TETANUS/TDAP  03/21/2028  . INFLUENZA VACCINE  Completed  . PNEUMOCOCCAL POLYSACCHARIDE VACCINE AGE 47-64 HIGH RISK  Completed     Discussed health benefits of physical activity, and encouraged her to engage in regular exercise appropriate for her age and condition.    --------------------------------------------------------------------  1. Annual physical exam   2. Vitamin D deficiency Lab Results  Component Value Date   VD25OH 8.8 05/29/2019    Now on OTC supplement vitamin D per endocrinology  3. Diabetes mellitus without complication (HCC) Well controlled on current medications. Continue routine follow up endocrinology   4. OSA on CPAP   5. Essential (primary) hypertension Well controlled.  Continue current medications.   - EKG 12-Lead  6. Morbid obesity (HCC) Work on losing weight with prudent diet and increase physical activity. On Ozempic by endocrinology.   7. Anxiety disorder, unspecified type Well controlled.   .  8. Chronic  constipation Is UTD with colonoscopy screening. Worse since starting Ozempic which she is otherwise doing very well with. Counseled her to stop amlodipine since her BP is very well controlled, but to call my office if she starts seeing home BP over 140/90.  Recommend starting scheduled OTC fiber supplement. Advised she may need to discuss dose of Ozempic with endocrinologist if these measures do not help.    [name], MD  [facility]

## 2019-08-31 NOTE — Patient Instructions (Signed)
.   Please review the attached list of medications and notify my office if there are any errors.   . Please bring all of your medications to every appointment so we can make sure that our medication list is the same as yours.    Let me know if your blood pressure gets above 140/90 after stopping the amlodipine   Start taking OTC metamucil powder mixed with a full gloss of water every evening

## 2019-10-06 ENCOUNTER — Other Ambulatory Visit: Payer: Self-pay | Admitting: Family Medicine

## 2019-11-24 ENCOUNTER — Other Ambulatory Visit: Payer: Self-pay | Admitting: Family Medicine

## 2019-12-31 ENCOUNTER — Other Ambulatory Visit: Payer: Self-pay | Admitting: Family Medicine

## 2020-01-22 LAB — HM DIABETES EYE EXAM

## 2020-02-15 ENCOUNTER — Other Ambulatory Visit: Payer: Self-pay | Admitting: Family Medicine

## 2020-04-05 ENCOUNTER — Other Ambulatory Visit: Payer: Self-pay | Admitting: Family Medicine

## 2020-04-19 LAB — HM MAMMOGRAPHY

## 2020-04-20 ENCOUNTER — Encounter (INDEPENDENT_AMBULATORY_CARE_PROVIDER_SITE_OTHER): Payer: Self-pay

## 2020-05-23 ENCOUNTER — Other Ambulatory Visit: Payer: Self-pay | Admitting: Family Medicine

## 2020-05-23 NOTE — Telephone Encounter (Signed)
Appointment 07/29/20- Rx refills until appointment

## 2020-07-15 ENCOUNTER — Encounter: Payer: BC Managed Care – PPO | Admitting: Family Medicine

## 2020-07-25 LAB — BASIC METABOLIC PANEL
BUN: 15 (ref 4–21)
Creatinine: 0.8 (ref 0.5–1.1)
Glucose: 130
Potassium: 3.8 (ref 3.4–5.3)

## 2020-07-25 LAB — LIPID PANEL
Cholesterol: 109 (ref 0–200)
HDL: 35 (ref 35–70)
LDL Cholesterol: 48
Triglycerides: 129 (ref 40–160)

## 2020-07-25 LAB — VITAMIN D 25 HYDROXY (VIT D DEFICIENCY, FRACTURES): Vit D, 25-Hydroxy: 40.7

## 2020-07-25 LAB — HEMOGLOBIN A1C: Hemoglobin A1C: 7.1

## 2020-07-25 LAB — HEPATIC FUNCTION PANEL: ALT: 14 (ref 7–35)

## 2020-07-25 LAB — TSH: TSH: 1.57 (ref 0.41–5.90)

## 2020-07-29 ENCOUNTER — Other Ambulatory Visit (HOSPITAL_COMMUNITY)
Admission: RE | Admit: 2020-07-29 | Discharge: 2020-07-29 | Disposition: A | Discharge status: Home / Self Care | Payer: BC Managed Care – PPO | Source: Ambulatory Visit | Attending: Family Medicine | Admitting: Family Medicine

## 2020-07-29 ENCOUNTER — Other Ambulatory Visit: Payer: Self-pay

## 2020-07-29 ENCOUNTER — Encounter: Payer: Self-pay | Admitting: Family Medicine

## 2020-07-29 ENCOUNTER — Ambulatory Visit (INDEPENDENT_AMBULATORY_CARE_PROVIDER_SITE_OTHER): Payer: BC Managed Care – PPO | Admitting: Family Medicine

## 2020-07-29 VITALS — BP 118/78 | HR 83 | Temp 97.5°F | Resp 16 | Ht 65.0 in | Wt 275.0 lb

## 2020-07-29 DIAGNOSIS — N898 Other specified noninflammatory disorders of vagina: Secondary | ICD-10-CM | POA: Diagnosis not present

## 2020-07-29 DIAGNOSIS — Z0000 Encounter for general adult medical examination without abnormal findings: Secondary | ICD-10-CM

## 2020-07-29 DIAGNOSIS — Z780 Asymptomatic menopausal state: Secondary | ICD-10-CM

## 2020-07-29 DIAGNOSIS — Z124 Encounter for screening for malignant neoplasm of cervix: Secondary | ICD-10-CM | POA: Insufficient documentation

## 2020-07-29 DIAGNOSIS — E119 Type 2 diabetes mellitus without complications: Secondary | ICD-10-CM

## 2020-07-29 DIAGNOSIS — R490 Dysphonia: Secondary | ICD-10-CM

## 2020-07-29 DIAGNOSIS — N3000 Acute cystitis without hematuria: Secondary | ICD-10-CM | POA: Diagnosis not present

## 2020-07-29 LAB — POCT URINALYSIS DIPSTICK
Bilirubin, UA: NEGATIVE
Glucose, UA: NEGATIVE
Ketones, UA: NEGATIVE
Nitrite, UA: POSITIVE
Protein, UA: NEGATIVE
Spec Grav, UA: 1.01 (ref 1.010–1.025)
Urobilinogen, UA: 0.2 E.U./dL
pH, UA: 6 (ref 5.0–8.0)

## 2020-07-29 NOTE — Telephone Encounter (Signed)
Patient was in the office and forgot to mention that she needs refills on these medications.

## 2020-07-29 NOTE — Progress Notes (Signed)
Complete physical exam    Patient: Tara Byrd   DOB: 1966/05/23   54 y.o. Female  MRN: [id number] Visit Date: 07/29/2020  Today's healthcare provider: [name], MD   Chief Complaint  Patient presents with  . Annual Exam  . Hypertension   Subjective    Tara Byrd is a 55 y.o. female who presents today for a complete physical exam.  She reports consuming a general diet. The patient does not participate in regular exercise at present. She generally feels fairly well. She reports sleeping fairly well. She does have additional problems to discuss today.  HPI  Follow up for Diabetes  This problem is managed by Endocrinology.  She reports good compliance with treatment. She feels that condition is Unchanged. She is not having side effects.   -----------------------------------------------------------------------------------------  Anxiety, Follow-up  She was last seen for anxiety 1 year ago. Changes made at last visit include none.   She reports good compliance with treatment. She reports good tolerance of treatment. She is not having side effects.   She feels her anxiety is mild and Unchanged since last visit.  Symptoms: No chest pain No difficulty concentrating  No dizziness No fatigue  No feelings of losing control No insomnia  No irritable No palpitations  No panic attacks No racing thoughts  No shortness of breath No sweating  No tremors/shakes    GAD-7 Results No flowsheet data found.  PHQ-9 Scores PHQ9 SCORE ONLY 07/29/2020 08/31/2019 03/21/2018  PHQ-9 Total Score 2 4 0    ---------------------------------------------------------------------------------------------------  Follow up for Morbid obesity:   The patient was last seen for this 1 year ago. Changes made at last visit include none. Patient was counseled to work on losing weight with prudent diet and increase physical activity. On Ozempic by endocrinology.   She reports good  compliance with treatment. She feels that condition is Unchanged. She is not having side effects.   -----------------------------------------------------------------------------------------  Hypertension, follow-up  BP Readings from Last 3 Encounters:  07/29/20 118/78  08/31/19 106/73  03/21/18 104/72   Wt Readings from Last 3 Encounters:  07/29/20 275 lb (124.7 kg)  08/31/19 263 lb 9.6 oz (119.6 kg)  03/21/18 231 lb (104.8 kg)     She was last seen for hypertension 1 year ago.  BP at that visit was 106/73. Management since that visit includes continue same medication.  She reports good compliance with treatment. She is not having side effects.  She is following a Regular diet. She is not exercising. She does not smoke.  Use of agents associated with hypertension: none.   Outside blood pressures are 124/76. Symptoms: No chest pain No chest pressure  No palpitations No syncope  No dyspnea No orthopnea  No paroxysmal nocturnal dyspnea No lower extremity edema   Pertinent labs: Lab Results  Component Value Date   CHOL 109 07/25/2020   HDL 35 07/25/2020   LDLCALC 48 07/25/2020   TRIG 129 07/25/2020   Lab Results  Component Value Date   NA 140 09/17/2015   K 3.8 07/25/2020   CREATININE 0.8 07/25/2020   GFRNONAA 99 09/17/2015   GFRAA 114 09/17/2015   GLUCOSE 118 (H) 09/17/2015     The ASCVD Risk score Denman George DC Jr., et al., 2013) failed to calculate for the following reasons:   The valid total cholesterol range is 130 to 320 mg/dL   ---------------------------------------------------------------------------------------------------  Vaginal Itching: Patient complains of vaginal itching for the past month. Associated symptoms include  occasional left side low back pain, malodorous urine and darker colored urine. She denies any vaginal discharge or dysuria. She has tried using fluconazole.   Past Medical History:  Diagnosis Date  . Anemia   . Anxiety   . Asthma    . Diabetes mellitus without complication (HCC)   . Hypertension   . Sleep apnea    CPAP - only uses "sometimes"   Past Surgical History:  Procedure Laterality Date  . ABLATION  2004   Cardiac  . CESAREAN SECTION  1986  . COLONOSCOPY WITH PROPOFOL N/A 09/06/2016   Procedure: COLONOSCOPY WITH PROPOFOL;  Surgeon: Midge Miniumarren Wohl, MD;  [facility];  Service: Endoscopy;  Laterality: N/A;  Diabetic - insulin sleep apnea   Social History   Socioeconomic History  . Marital status: Divorced    Spouse name: Not on file  . Number of children: 1  . Years of education: College  . Highest education level: Not on file  Occupational History  . Occupation: Employed    Comment: works at [employer]  Tobacco Use  . Smoking status: Former Smoker    Packs/day: 0.50    Years: 20.00    Pack years: 10.00    Types: Cigarettes    Quit date: 06/25/2002    Years since quitting: 18.1  . Smokeless tobacco: Never Used  Substance and Sexual Activity  . Alcohol use: No    Alcohol/week: 0.0 standard drinks  . Drug use: No  . Sexual activity: Not on file  Other Topics Concern  . Not on file  Social History Narrative  . Not on file   Social Determinants of Health   Financial Resource Strain: Not on file  Food Insecurity: Not on file  Transportation Needs: Not on file  Physical Activity: Not on file  Stress: Not on file  Social Connections: Not on file  Intimate Partner Violence: Not on file   Family Status  Relation Name Status  . Mother  Deceased at age 55       Cause of death: Renal Failure  . Father  Alive  . Sister  Alive  . Brother  Alive   Family History  Problem Relation Age of Onset  . Diabetes Mother   . Kidney disease Mother   . Heart failure Mother   . Diabetes Father   . Colon polyps Father [phone]  . Diabetes Sister   . Asthma Brother   . Ulcers Brother    Allergies  Allergen Reactions  . Almond (Diagnostic) Shortness Of Breath    Pt states "nuts" cause SOB  .  Justicia Adhatoda (Malabar Nut Tree)  [Justicia Adhatoda] Shortness Of Breath  . Tomato Itching and Hives    Roma tomatoes only  . Aspirin Hives  . Chocolate Hives and Itching  . Enalapril Maleate Cough  . Other     Perfume    Patient Care Team: Malva LimesFisher, Jefry Lesinski E, MD as PCP - General (Family Medicine) Venetia Nightostou, Jean, MD as Referring Physician (Endocrinology) System, [place], Silva BandyKristi [facility](Optometry)   Medications: Outpatient Medications Prior to Visit  Medication Sig  . albuterol (VENTOLIN HFA) 108 (90 Base) MCG/ACT inhaler INHALE 2 PUFFS INTO THE LUNGS EVERY 6 HOURS AS NEEDED  . amitriptyline (ELAVIL) 10 MG tablet Take 1-2 tablets (10-20 mg total) by mouth every evening.  Marland Kitchen. atorvastatin (LIPITOR) 20 MG tablet Take 20 mg by mouth daily.  . Cyanocobalamin (VITAMIN B-12 CR) 1000 MCG TBCR Take 1 tablet by mouth daily.  .Marland Kitchen  glucose blood test strip 1 strip. As directed  . Insulin Glargine (BASAGLAR KWIKPEN [place]) Inject 30 Units into the skin every morning. As directed  . metFORMIN (GLUCOPHAGE-XR) 500 MG 24 hr tablet TAKE 2 TABLETS (1,000 MG TOTAL) BY MOUTH TWICE DAILY (Patient taking differently: 500 mg 2 (two) times daily.)  . montelukast (SINGULAIR) 10 MG tablet Take 1 tablet (10 mg total) by mouth daily.  Marland Kitchen OZEMPIC, 1 MG/DOSE, 2 MG/1.5ML SOPN   . propranolol (INDERAL) 40 MG tablet TAKE 1 TABLET (40MG ) BY MOUTH ONCE DAILY AS NEEDED  . valsartan-hydrochlorothiazide (DIOVAN-HCT) 160-12.5 MG tablet Take 1 tablet by mouth daily.  . Vitamin D, Cholecalciferol, 25 MCG (1000 UT) TABS Take 1,000 Units by mouth daily.   No facility-administered medications prior to visit.    Review of Systems  Constitutional: Negative for chills, fatigue and fever.  HENT: Positive for dental problem, rhinorrhea, sinus pressure and trouble swallowing. Negative for congestion, ear pain, sneezing and sore throat.   Eyes: Positive for itching. Negative for pain and redness.  Respiratory: Negative for cough,  shortness of breath and wheezing.   Cardiovascular: Negative for chest pain and leg swelling.  Gastrointestinal: Positive for abdominal distention and constipation. Negative for abdominal pain, blood in stool, diarrhea and nausea.  Endocrine: Negative for polydipsia and polyphagia.  Genitourinary: Positive for enuresis and frequency. Negative for dysuria, flank pain, hematuria, pelvic pain, vaginal bleeding and vaginal discharge.       Vaginal itching  Musculoskeletal: Positive for arthralgias. Negative for back pain, gait problem and joint swelling.  Skin: Negative for rash.  Allergic/Immunologic: Positive for environmental allergies and food allergies.  Neurological: Positive for headaches. Negative for dizziness, tremors, seizures, weakness, light-headedness and numbness.  Hematological: Negative for adenopathy.  Psychiatric/Behavioral: Negative.  Negative for behavioral problems, confusion and dysphoric mood. The patient is not nervous/anxious and is not hyperactive.      Objective    BP 118/78 (BP Location: Left Arm, Patient Position: Sitting, Cuff Size: Large)   Pulse 83   Temp (!) 97.5 F (36.4 C) (Temporal)   Resp 16   Ht 5\' 5"  (1.651 m)   Wt 275 lb (124.7 kg)   LMP 04/30/2019   BMI 45.76 kg/m   Physical Exam   General Appearance:    Severely obese female. Alert, cooperative, in no acute distress, appears stated age   Head:    Normocephalic, without obvious abnormality, atraumatic  Eyes:    PERRL, conjunctiva/corneas clear, EOM's intact, fundi    benign, both eyes  Ears:    Normal TM's and external ear canals, both ears  Nose:   Nares normal, septum midline, mucosa normal, no drainage    or sinus tenderness  Throat:   Lips, mucosa, and tongue normal; teeth and gums normal  Neck:   Supple, symmetrical, trachea midline, no adenopathy;    thyroid:  no enlargement/tenderness/nodules; no carotid   bruit or JVD  Back:     Symmetric, no curvature, ROM normal, no CVA  tenderness  Lungs:     Clear to auscultation bilaterally, respirations unlabored  Chest Wall:    No tenderness or deformity   Heart:    Normal heart rate. Normal rhythm. No murmurs, rubs, or gallops.   Breast Exam:    normal appearance, no masses or tenderness  Abdomen:     Soft, non-tender, bowel sounds active all four quadrants,    no masses, no organomegaly  Pelvic:    cervix normal in appearance, no adnexal masses or  tenderness, no cervical motion tenderness and urethra without abnormality or discharge  Extremities:   All extremities are intact. No cyanosis or edema  Pulses:   2+ and symmetric all extremities  Skin:   Skin color, texture, turgor normal, no rashes or lesions  Lymph nodes:   Cervical, supraclavicular, and axillary nodes normal  Neurologic:   CNII-XII intact, normal strength, sensation and reflexes    throughout    Last depression screening scores PHQ 2/9 Scores 07/29/2020 08/31/2019 03/21/2018  PHQ - 2 Score 0 0 0  PHQ- 9 Score 2 4 0   Last fall risk screening Fall Risk  07/29/2020  Falls in the past year? 0  Number falls in past yr: 0  Injury with Fall? 0  Follow up Falls evaluation completed   Last Audit-C alcohol use screening Alcohol Use Disorder Test (AUDIT) 07/29/2020  1. How often do you have a drink containing alcohol? 0  2. How many drinks containing alcohol do you have on a typical day when you are drinking? 0  3. How often do you have six or more drinks on one occasion? 0  AUDIT-C Score 0  Alcohol Brief Interventions/Follow-up AUDIT Score <7 follow-up not indicated   A score of 3 or more in women, and 4 or more in men indicates increased risk for alcohol abuse, EXCEPT if all of the points are from question 1   Results for orders placed or performed in visit on 07/29/20  POCT Urinalysis Dipstick  Result Value Ref Range   Color, UA yellow    Clarity, UA cloudy    Glucose, UA Negative Negative   Bilirubin, UA negative    Ketones, UA negative    Spec  Grav, UA 1.010 1.010 - 1.025   Blood, UA Trace (non-hemolyzed)    pH, UA 6.0 5.0 - 8.0   Protein, UA Negative Negative   Urobilinogen, UA 0.2 0.2 or 1.0 E.U./dL   Nitrite, UA positive    Leukocytes, UA Large (3+) (A) Negative   Appearance     Odor    Results for orders placed or performed in visit on 07/29/20  VITAMIN D 25 Hydroxy (Vit-D Deficiency, Fractures)  Result Value Ref Range   Vit D, 25-Hydroxy 40.7   Basic metabolic panel  Result Value Ref Range   Glucose 130    BUN 15 4 - 21   Creatinine 0.8 0.5 - 1.1   Potassium 3.8 3.4 - 5.3  Lipid panel  Result Value Ref Range   Triglycerides 129 40 - 160   Cholesterol 109 0 - 200   HDL 35 35 - 70   LDL Cholesterol 48   Hepatic function panel  Result Value Ref Range   ALT 14 7 - 35  Hemoglobin A1c  Result Value Ref Range   Hemoglobin A1C 7.1   TSH  Result Value Ref Range   TSH 1.57 0.41 - 5.90    Assessment & Plan    Routine Health Maintenance and Physical Exam  Exercise Activities and Dietary recommendations Goals   None     Immunization History  Administered Date(s) Administered  . Influenza-Unspecified 02/25/2017, 02/28/2018, 02/25/2019, 03/17/2020  . PFIZER(Purple Top)SARS-COV-2 Vaccination 07/22/2019, 08/12/2019  . Pneumococcal Polysaccharide-23 07/25/2012  . Td 03/21/2018  . Tdap 06/25/2006  . Zoster Recombinat (Shingrix) 03/21/2018, 05/30/2018    Health Maintenance  Topic Date Due  . Hepatitis C Screening  Never done  . FOOT EXAM  Never done  . HIV Screening  Never done  .  COVID-19 Vaccine (3 - Booster for Pfizer series) 02/09/2020  . PAP SMEAR-Modifier  09/12/2020  . OPHTHALMOLOGY EXAM  01/21/2021  . HEMOGLOBIN A1C  01/22/2021  . MAMMOGRAM  04/19/2021  . COLONOSCOPY (Pts 45-75yrs Insurance coverage will need to be confirmed)  09/07/2026  . TETANUS/TDAP  03/21/2028  . INFLUENZA VACCINE  Completed  . PNEUMOCOCCAL POLYSACCHARIDE VACCINE AGE 36-64 HIGH RISK  Completed    Discussed health benefits  of physical activity, and encouraged her to engage in regular exercise appropriate for her age and condition.   1. Annual physical exam Generally doing well.   2. Post-menopausal LMP 2020 with no post menopausal bleeding.   3. Vaginal itching No discharge on exam. Check candida on Pap.   4. Acute cystitis without hematuria  - Urine Culture  5. Screening for cervical cancer  - Cytology - PAP  6. Morbid obesity (HCC) Encourage healthy diet and exercise.   7. Diabetes mellitus without complication (HCC) Well controlled.  Continue routine follow up endocrinology.     8. Dysophonia She reports that for at least the last year she has daily episodes of feeling a spasm in her throat causing difficulty talking and swallowing. Sometimes triggered by yawning, but is getting more frequent and bothersome. Will refer ENT to evaluation.     The entirety of the information documented in the History of Present Illness, Review of Systems and Physical Exam were personally obtained by me. Portions of this information were initially documented by the CMA and reviewed by me for thoroughness and accuracy.      [name], MD  [facility] [phone] (phone) [phone] (fax)  [facility]

## 2020-08-01 ENCOUNTER — Other Ambulatory Visit: Payer: Self-pay | Admitting: Family Medicine

## 2020-08-01 DIAGNOSIS — N3001 Acute cystitis with hematuria: Secondary | ICD-10-CM

## 2020-08-01 LAB — URINE CULTURE

## 2020-08-01 MED ORDER — CEPHALEXIN 500 MG PO CAPS: 500.0000 mg | ORAL_CAPSULE | Freq: Two times a day (BID) | ORAL | 0 refills | Status: AC

## 2020-08-01 MED ORDER — AMITRIPTYLINE HCL 10 MG PO TABS: 10.0000 mg | ORAL_TABLET | Freq: Every evening | ORAL | 5 refills | Status: DC

## 2020-08-01 MED ORDER — PROPRANOLOL HCL 40 MG PO TABS: 40.0000 mg | ORAL_TABLET | Freq: Every day | ORAL | 5 refills | Status: DC | PRN

## 2020-08-01 MED ORDER — VALSARTAN-HYDROCHLOROTHIAZIDE 160-12.5 MG PO TABS: 1.0000 | ORAL_TABLET | Freq: Every day | ORAL | 2 refills | Status: DC

## 2020-08-01 MED ORDER — ALBUTEROL SULFATE HFA 108 (90 BASE) MCG/ACT IN AERS: INHALATION_SPRAY | RESPIRATORY_TRACT | 3 refills | Status: DC

## 2020-08-03 LAB — CYTOLOGY - PAP
Comment: NEGATIVE
Diagnosis: NEGATIVE
High risk HPV: NEGATIVE

## 2020-08-16 ENCOUNTER — Telehealth: Payer: Self-pay | Admitting: Family Medicine

## 2020-08-16 DIAGNOSIS — R490 Dysphonia: Secondary | ICD-10-CM

## 2020-08-16 NOTE — Telephone Encounter (Signed)
Ok to place referral? Please advise. Thanks!  

## 2020-08-16 NOTE — Telephone Encounter (Signed)
Copied from CRM [id number]. Topic: Referral - Request for Referral >> Aug 16, 2020  1:03 PM Jaquita Rector A wrote:  Has patient seen PCP for this complaint? Yes.   *If NO, is insurance requiring patient see PCP for this issue before PCP can refer them? Referral for which specialty: ENT  Preferred provider/office: ENT at [facility]  Reason for referral: spasms in the throat

## 2020-09-28 ENCOUNTER — Other Ambulatory Visit: Payer: Self-pay | Admitting: Family Medicine

## 2020-10-18 ENCOUNTER — Encounter: Payer: Self-pay | Admitting: Family Medicine

## 2021-02-20 LAB — HM DIABETES EYE EXAM

## 2021-03-07 LAB — HEMOGLOBIN A1C: Hemoglobin A1C: 6.9

## 2021-03-27 ENCOUNTER — Other Ambulatory Visit: Payer: Self-pay | Admitting: Family Medicine

## 2021-04-25 LAB — HM MAMMOGRAPHY

## 2021-05-02 ENCOUNTER — Telehealth (INDEPENDENT_AMBULATORY_CARE_PROVIDER_SITE_OTHER): Payer: BC Managed Care – PPO | Admitting: Family Medicine

## 2021-05-02 DIAGNOSIS — R490 Dysphonia: Secondary | ICD-10-CM

## 2021-05-02 DIAGNOSIS — N898 Other specified noninflammatory disorders of vagina: Secondary | ICD-10-CM

## 2021-05-02 DIAGNOSIS — N393 Stress incontinence (female) (male): Secondary | ICD-10-CM

## 2021-05-02 MED ORDER — FLUCONAZOLE 150 MG PO TABS: 150.0000 mg | ORAL_TABLET | Freq: Once | ORAL | 0 refills | Status: AC

## 2021-05-02 NOTE — Progress Notes (Signed)
MyChart Video Visit    Virtual Visit via Video Note   This visit type was conducted due to national recommendations for restrictions regarding the COVID-19 Pandemic (e.g. social distancing) in an effort to limit this patient's exposure and mitigate transmission in our community. This patient is at least at moderate risk for complications without adequate follow up. This format is felt to be most appropriate for this patient at this time. Physical exam was limited by quality of the video and audio technology used for the visit.   Patient location: Work Provider location: Home  I discussed the limitations of evaluation and management by telemedicine and the availability of in person appointments. The patient expressed understanding and agreed to proceed.  Patient: Tara Byrd   DOB: [birth year]   55 y.o. Female  MRN: [id number] Visit Date: 05/02/2021  Today's healthcare provider: [name], MD   No chief complaint on file.  Subjective    HPI  Pt would like a referral to Pershing Cox at [facility] for incontinence. Having frequent bladder leakage with coughing, sneezing, exertions, sometimes just standing up.   She also continues to have daily episodes of feeling spasms in her throat making it difficult to talk and swallow. Sometimes triggered by yawning. No heartburn or other reflux symptoms. She would like to see ENT at [facility] where she works.   She also reports having her typical yeast infection symptoms recently and request refill for Diflucan.  Medications: Outpatient Medications Prior to Visit  Medication Sig   albuterol (VENTOLIN HFA) 108 (90 Base) MCG/ACT inhaler INHALE 2 PUFFS INTO THE LUNGS EVERY 6 HOURS AS NEEDED   amitriptyline (ELAVIL) 10 MG tablet Take 1-2 tablets (10-20 mg total) by mouth every evening.   atorvastatin (LIPITOR) 20 MG tablet Take 20 mg by mouth daily.   Cyanocobalamin (VITAMIN B-12 CR) 1000 MCG TBCR Take 1 tablet by mouth daily.   glucose  blood test strip 1 strip. As directed   Insulin Glargine (BASAGLAR KWIKPEN [place]) Inject 30 Units into the skin every morning. As directed   metFORMIN (GLUCOPHAGE-XR) 500 MG 24 hr tablet TAKE 2 TABLETS (1,000 MG TOTAL) BY MOUTH TWICE DAILY (Patient taking differently: 500 mg 2 (two) times daily.)   montelukast (SINGULAIR) 10 MG tablet Take 1 tablet (10 mg total) by mouth daily.   propranolol (INDERAL) 40 MG tablet Take 1 tablet (40 mg total) by mouth daily as needed.   tirzepatide [facility]) 10 MG/0.5ML Pen Inject into the skin.   valsartan-hydrochlorothiazide (DIOVAN-HCT) 160-12.5 MG tablet Take 1 tablet by mouth daily.   OZEMPIC, 1 MG/DOSE, 2 MG/1.5ML SOPN  (Patient not taking: Reported on 05/02/2021)   Vitamin D, Cholecalciferol, 25 MCG (1000 UT) TABS Take 1,000 Units by mouth daily.   No facility-administered medications prior to visit.    Review of Systems    Objective    LMP 04/30/2019    Physical Exam   Awake, alert, oriented x 3. In no apparent distress   Assessment & Plan     1. Stress incontinence of urine  - Ambulatory referral to Urology  2. Dysphonia  - Ambulatory referral to ENT  3. Vaginal itching  - fluconazole (DIFLUCAN) 150 MG tablet; Take 1 tablet (150 mg total) by mouth once for 1 dose.  Dispense: 1 tablet; Refill: 0      I discussed the assessment and treatment plan with the patient. The patient was provided an opportunity to ask questions and all were answered. The patient agreed  with the plan and demonstrated an understanding of the instructions.   The patient was advised to call back or seek an in-person evaluation if the symptoms worsen or if the condition fails to improve as anticipated.  I provided 13 minutes of non-face-to-face time during this encounter.  The entirety of the information documented in the History of Present Illness, Review of Systems and Physical Exam were personally obtained by me. Portions of this information were initially  documented by the CMA and reviewed by me for thoroughness and accuracy.    [name], MD [facility] [phone] (phone) [phone] (fax)  [facility]

## 2021-05-11 ENCOUNTER — Other Ambulatory Visit: Payer: Self-pay | Admitting: Family Medicine

## 2021-05-11 NOTE — Telephone Encounter (Signed)
Requested medications are due for refill today.  yes  Requested medications are on the active medications list.  yes  Last refill. 08/01/2020  Future visit scheduled.   no  Notes to clinic.  Labs are expired

## 2021-05-30 ENCOUNTER — Encounter: Payer: Self-pay | Admitting: Family Medicine

## 2021-07-18 ENCOUNTER — Telehealth (INDEPENDENT_AMBULATORY_CARE_PROVIDER_SITE_OTHER): Payer: BC Managed Care – PPO | Admitting: Family Medicine

## 2021-07-18 DIAGNOSIS — M79672 Pain in left foot: Secondary | ICD-10-CM | POA: Diagnosis not present

## 2021-07-18 NOTE — Progress Notes (Signed)
MyChart Video Visit    Virtual Visit via Video Note   This visit type was conducted due to national recommendations for restrictions regarding the COVID-19 Pandemic (e.g. social distancing) in an effort to limit this patient's exposure and mitigate transmission in our community. This patient is at least at moderate risk for complications without adequate follow up. This format is felt to be most appropriate for this patient at this time. Physical exam was limited by quality of the video and audio technology used for the visit.   Patient location: home Provider location: bfp  I discussed the limitations of evaluation and management by telemedicine and the availability of in person appointments. The patient expressed understanding and agreed to proceed.  Patient: Tara Byrd   DOB: [birth year]   56 y.o. Female  MRN: [id number] Visit Date: 07/18/2021  Today's healthcare provider: [name], MD   Chief Complaint  Patient presents with   Foot Pain   Subjective    HPI  Foot pain: Patient complains of foot pain for the past 2 months. States she broke left foot in 2 places about 18-19 years ago but did not require surgery. But pain is now getting worse and  OTC ibuprofen and naprosyn. Has been using boot that her niece gave her in order to ambulate.   Medications: Outpatient Medications Prior to Visit  Medication Sig   albuterol (VENTOLIN HFA) 108 (90 Base) MCG/ACT inhaler INHALE 2 PUFFS INTO THE LUNGS EVERY 6 HOURS AS NEEDED   amitriptyline (ELAVIL) 10 MG tablet Take 1-2 tablets (10-20 mg total) by mouth every evening.   atorvastatin (LIPITOR) 20 MG tablet Take 20 mg by mouth daily.   Cyanocobalamin (VITAMIN B-12 CR) 1000 MCG TBCR Take 1 tablet by mouth daily.   glucose blood test strip 1 strip. As directed   Insulin Glargine (BASAGLAR KWIKPEN [place]) Inject 30 Units into the skin every morning. As directed   metFORMIN (GLUCOPHAGE-XR) 500 MG 24 hr tablet TAKE 2 TABLETS  (1,000 MG TOTAL) BY MOUTH TWICE DAILY (Patient taking differently: 500 mg 2 (two) times daily.)   montelukast (SINGULAIR) 10 MG tablet Take 1 tablet (10 mg total) by mouth daily.   OZEMPIC, 1 MG/DOSE, 2 MG/1.5ML SOPN  (Patient not taking: Reported on 05/02/2021)   propranolol (INDERAL) 40 MG tablet Take 1 tablet (40 mg total) by mouth daily as needed.   tirzepatide [facility]) 10 MG/0.5ML Pen Inject into the skin.   valsartan-hydrochlorothiazide (DIOVAN-HCT) 160-12.5 MG tablet Take 1 tablet by mouth daily.   Vitamin D, Cholecalciferol, 25 MCG (1000 UT) TABS Take 1,000 Units by mouth daily.   No facility-administered medications prior to visit.    Review of Systems  Constitutional:  Negative for appetite change, chills, fatigue and fever.  Respiratory:  Negative for chest tightness and shortness of breath.   Cardiovascular:  Negative for chest pain and palpitations.  Gastrointestinal:  Negative for abdominal pain, nausea and vomiting.  Musculoskeletal:  Positive for arthralgias (foot pain).  Neurological:  Negative for dizziness and weakness.     Objective    LMP 04/30/2019    Physical Exam   Awake, alert, oriented x 3. In no apparent distress   Assessment & Plan     1. Left foot pain Remote foot injury. Fairly conservative care.  - Ambulatory referral to Podiatry   No follow-ups on file.     I discussed the assessment and treatment plan with the patient. The patient was provided an opportunity to ask questions  and all were answered. The patient agreed with the plan and demonstrated an understanding of the instructions.   The patient was advised to call back or seek an in-person evaluation if the symptoms worsen or if the condition fails to improve as anticipated.  I provided 10 minutes of non-face-to-face time during this encounter.  The entirety of the information documented in the History of Present Illness, Review of Systems and Physical Exam were personally obtained by  me. Portions of this information were initially documented by the CMA and reviewed by me for thoroughness and accuracy.    [name], MD [facility] [phone] (phone) [phone] (fax)  [facility]

## 2021-07-19 ENCOUNTER — Other Ambulatory Visit: Payer: Self-pay | Admitting: Family Medicine

## 2021-07-26 ENCOUNTER — Encounter: Payer: Self-pay | Admitting: Family Medicine

## 2021-07-26 DIAGNOSIS — M62838 Other muscle spasm: Secondary | ICD-10-CM

## 2021-07-27 MED ORDER — CYCLOBENZAPRINE HCL 5 MG PO TABS: 5.0000 mg | ORAL_TABLET | Freq: Three times a day (TID) | ORAL | 1 refills | Status: DC | PRN

## 2021-08-29 ENCOUNTER — Encounter: Payer: Self-pay | Admitting: Family Medicine

## 2021-09-01 NOTE — Telephone Encounter (Signed)
Has not been seen in the office since 07/2020. She needs an in-office visit for this ?

## 2021-09-05 LAB — LIPID PANEL: LDL Cholesterol: 98

## 2021-09-05 LAB — MICROALBUMIN / CREATININE URINE RATIO: Microalb Creat Ratio: 85.2

## 2021-09-05 LAB — VITAMIN D 25 HYDROXY (VIT D DEFICIENCY, FRACTURES): Vit D, 25-Hydroxy: 53.6

## 2021-09-06 ENCOUNTER — Other Ambulatory Visit: Payer: Self-pay | Admitting: Family Medicine

## 2021-11-13 ENCOUNTER — Other Ambulatory Visit: Payer: Self-pay | Admitting: Family Medicine

## 2021-11-27 ENCOUNTER — Other Ambulatory Visit: Payer: Self-pay | Admitting: Family Medicine

## 2021-12-12 ENCOUNTER — Ambulatory Visit: Payer: BC Managed Care – PPO | Admitting: Family Medicine

## 2021-12-12 NOTE — Progress Notes (Deleted)
      I,Tara Byrd,acting as a scribe for Tara Merry, MD.,have documented all relevant documentation on the behalf of Tara Merry, MD,as directed by  Tara Merry, MD while in the presence of Tara Merry, MD.  Established patient visit   Patient: Tara Byrd   DOB: October 21, 1965   56 y.o. Female  MRN: [id number] Visit Date: 12/12/2021  Today's healthcare provider: [name], MD   No chief complaint on file.  Subjective    Patient is a 56 yr old female presenting for mole   Medications: Outpatient Medications Prior to Visit  Medication Sig   albuterol (VENTOLIN HFA) 108 (90 Base) MCG/ACT inhaler INHALE 2 PUFFS INTO THE LUNGS EVERY 6 HOURS AS NEEDED   amitriptyline (ELAVIL) 10 MG tablet Take 1-2 tablets (10-20 mg total) by mouth every evening.   atorvastatin (LIPITOR) 20 MG tablet Take 20 mg by mouth daily.   Cyanocobalamin (VITAMIN B-12 CR) 1000 MCG TBCR Take 1 tablet by mouth daily.   cyclobenzaprine (FLEXERIL) 5 MG tablet Take 1 tablet (5 mg total) by mouth 3 (three) times daily as needed for muscle spasms.   glucose blood test strip 1 strip. As directed   Insulin Glargine (BASAGLAR KWIKPEN [place]) Inject 30 Units into the skin every morning. As directed   metFORMIN (GLUCOPHAGE-XR) 500 MG 24 hr tablet TAKE 2 TABLETS (1,000 MG TOTAL) BY MOUTH TWICE DAILY (Patient taking differently: 500 mg 2 (two) times daily.)   montelukast (SINGULAIR) 10 MG tablet Take 1 tablet (10 mg total) by mouth daily.   OZEMPIC, 1 MG/DOSE, 2 MG/1.5ML SOPN  (Patient not taking: Reported on 05/02/2021)   propranolol (INDERAL) 40 MG tablet Take 1 tablet (40 mg total) by mouth daily as needed.   tirzepatide [facility]) 10 MG/0.5ML Pen Inject into the skin.   valsartan-hydrochlorothiazide (DIOVAN-HCT) 160-12.5 MG tablet Take 1 tablet by mouth daily.   Vitamin D, Cholecalciferol, 25 MCG (1000 UT) TABS Take 1,000 Units by mouth daily.   No facility-administered medications prior to visit.     Review of Systems  {Labs  Heme  Chem  Endocrine  Serology  Results Review (optional):23779}   Objective    LMP 04/30/2019  {Show previous vital signs (optional):23777}  Physical Exam  ***  No results found for any visits on 12/12/21.  Assessment & Plan     ***  No follow-ups on file.      {provider attestation***:1}   Tara Merry, MD  [facility] [phone] (phone) [phone] (fax)  [facility]

## 2022-01-04 LAB — HEMOGLOBIN A1C: Hemoglobin A1C: 6.5

## 2022-01-31 ENCOUNTER — Ambulatory Visit: Payer: BC Managed Care – PPO | Admitting: Family Medicine

## 2022-02-08 ENCOUNTER — Other Ambulatory Visit: Payer: Self-pay | Admitting: Family Medicine

## 2022-02-08 DIAGNOSIS — M62838 Other muscle spasm: Secondary | ICD-10-CM

## 2022-02-21 ENCOUNTER — Encounter: Payer: Self-pay | Admitting: Family Medicine

## 2022-03-06 HISTORY — PX: ANKLE ARTHROSCOPY: SHX545

## 2022-03-06 LAB — HEMOGLOBIN A1C: Hemoglobin A1C: 7.3

## 2022-03-07 ENCOUNTER — Encounter: Payer: Self-pay | Admitting: Family Medicine

## 2022-03-07 LAB — CBC: RBC: 3.28 — AB (ref 3.87–5.11)

## 2022-03-07 LAB — BASIC METABOLIC PANEL
BUN: 11 (ref 4–21)
CO2: 27 — AB (ref 13–22)
Chloride: 105 (ref 99–108)
Creatinine: 0.8 (ref 0.5–1.1)
Glucose: 178
Potassium: 4.4 mEq/L (ref 3.5–5.1)
Sodium: 140 (ref 137–147)

## 2022-03-07 LAB — COMPREHENSIVE METABOLIC PANEL
Calcium: 9.2 (ref 8.7–10.7)
eGFR: >90

## 2022-03-07 LAB — CBC AND DIFFERENTIAL
HCT: 27 — AB (ref 36–46)
Hemoglobin: 8.9 — AB (ref 12.0–16.0)
Platelets: 414 10*3/uL — AB (ref 150–400)
WBC: 10.4

## 2022-03-30 ENCOUNTER — Ambulatory Visit: Payer: BC Managed Care – PPO | Admitting: Family Medicine

## 2022-03-30 ENCOUNTER — Encounter: Payer: Self-pay | Admitting: Family Medicine

## 2022-03-30 VITALS — BP 174/105 | HR 99 | Temp 98.3°F | Resp 18

## 2022-03-30 DIAGNOSIS — E1169 Type 2 diabetes mellitus with other specified complication: Secondary | ICD-10-CM

## 2022-03-30 DIAGNOSIS — I10 Essential (primary) hypertension: Secondary | ICD-10-CM

## 2022-03-30 DIAGNOSIS — E785 Hyperlipidemia, unspecified: Secondary | ICD-10-CM | POA: Diagnosis not present

## 2022-03-30 DIAGNOSIS — E119 Type 2 diabetes mellitus without complications: Secondary | ICD-10-CM

## 2022-03-30 MED ORDER — OZEMPIC (1 MG/DOSE) 2 MG/1.5ML ~~LOC~~ SOPN: 2.0000 mg | PEN_INJECTOR | Freq: Every day | SUBCUTANEOUS | 12 refills | Status: DC

## 2022-03-30 NOTE — Progress Notes (Signed)
I,Roshena L Chambers,acting as a scribe for Lelon Huh, MD.,have documented all relevant documentation on the behalf of Lelon Huh, MD,as directed by  Lelon Huh, MD while in the presence of Lelon Huh, MD.     Established patient visit   Patient: Tara Byrd   DOB: 29-Apr-1966   56 y.o. Female  MRN: [id number] Visit Date: 03/30/2022  Today's healthcare provider: Lelon Huh, MD   Chief Complaint  Patient presents with   Diabetes   Subjective    HPI  Diabetes Mellitus Type II, Follow-up  Lab Results  Component Value Date   HGBA1C 7.3 03/06/2022   HGBA1C 6.5 01/04/2022   Wt Readings from Last 3 Encounters:  07/29/20 275 lb (124.7 kg)  08/31/19 263 lb 9.6 oz (119.6 kg)  03/21/18 231 lb (104.8 kg)   Last seen for diabetes on 07/29/2020.   Management since then includes advising patient to continue routine follow up with Endocrinology. She reports good compliance with treatment. Patient was last seen by Endocrinology on 01/04/2022. Her A1C was 6.5 at that time, and patient was released from Endocrinology. Patient is requesting to have her diabetes managed here.  She is not having side effects.  Symptoms: No fatigue No foot ulcerations  No appetite changes No nausea  No paresthesia of the feet  No polydipsia  No polyuria No visual disturbances   No vomiting     Home blood sugar records: fasting range: 130-150's  Episodes of hypoglycemia? No    Current insulin regiment: none Most Recent Eye Exam: <1 year ago Current exercise: none Current diet habits: well balanced  Pertinent Labs: Lab Results  Component Value Date   NA 140 03/07/2022   K 4.4 03/07/2022   CREATININE 0.8 03/07/2022     ---------------------------------------------------------------------------------------------------   Skin lesion: Patient complains of a new skin lesion on the left side of her face. She denies any pain or itching.   Medications: Outpatient  Medications Prior to Visit  Medication Sig   albuterol (VENTOLIN HFA) 108 (90 Base) MCG/ACT inhaler INHALE 2 PUFFS INTO THE LUNGS EVERY 6 HOURS AS NEEDED   amitriptyline (ELAVIL) 10 MG tablet Take 1-2 tablets (10-20 mg total) by mouth every evening.   atorvastatin (LIPITOR) 20 MG tablet Take 20 mg by mouth daily.   Cyanocobalamin (VITAMIN B-12 CR) 1000 MCG TBCR Take 1 tablet by mouth daily.   cyclobenzaprine (FLEXERIL) 5 MG tablet Take 1 tablet (5 mg total) by mouth 3 (three) times daily as needed for muscle spasms.   glucose blood test strip 1 strip. As directed   Insulin Glargine (BASAGLAR KWIKPEN [place]) Inject 20 Units into the skin daily as needed.   metFORMIN (GLUCOPHAGE-XR) 500 MG 24 hr tablet TAKE 2 TABLETS (1,000 MG TOTAL) BY MOUTH TWICE DAILY (Patient taking differently: 500 mg 2 (two) times daily.)   montelukast (SINGULAIR) 10 MG tablet Take 1 tablet (10 mg total) by mouth daily.   OZEMPIC, 1 MG/DOSE, 2 MG/1.5ML SOPN    propranolol (INDERAL) 40 MG tablet Take 1 tablet (40 mg total) by mouth daily as needed.   valsartan-hydrochlorothiazide (DIOVAN-HCT) 160-12.5 MG tablet Take 1 tablet by mouth daily.   Vitamin D, Cholecalciferol, 25 MCG (1000 UT) TABS Take 1,000 Units by mouth daily.   [DISCONTINUED] tirzepatide [facility]) 10 MG/0.5ML Pen Inject into the skin. (Patient not taking: Reported on 03/30/2022)   No facility-administered medications prior to visit.    Review of Systems  Constitutional:  Negative for appetite change, chills, fatigue  and fever.  Respiratory:  Negative for chest tightness and shortness of breath.   Cardiovascular:  Negative for chest pain and palpitations.  Gastrointestinal:  Negative for abdominal pain, nausea and vomiting.  Skin:        Facial skin lesion  Neurological:  Negative for dizziness and weakness.       Objective    BP (!) 174/105 (BP Location: Left Arm, Patient Position: Sitting, Cuff Size: Large)   Pulse 99   Temp 98.3 F (36.8 C) (Oral)    Resp 18   LMP 04/30/2019   SpO2 100% Comment: room air  Today's Vitals   03/30/22 1629 03/30/22 1634  BP: (!) 156/82 (!) 174/105  Pulse: 100 99  Resp: 18   Temp: 98.3 F (36.8 C)   TempSrc: Oral   SpO2: 100%    There is no height or weight on file to calculate BMI.   Physical Exam    General: Appearance:    Severely obese female in no acute distress  Eyes:    PERRL, conjunctiva/corneas clear, EOM's intact       Lungs:     Clear to auscultation bilaterally, respirations unlabored  Heart:    Normal heart rate. Normal rhythm. No murmurs, rubs, or gallops.    MS:   All extremities are intact.    Neurologic:   Awake, alert, oriented x 3. No apparent focal neurological defect.         Assessment & Plan     1. Type 2 diabetes mellitus with hyperlipidemia (HCC) Doing well on current  OZEMPIC, 1 MG/DOSE, 2 MG/1.5ML SOPN; Inject 2 mg as directed daily.  Dispense: 6 mL; Refill: 12  2. Essential (primary) hypertension Not at goal. Continue current medications.  Work on losing more weight. Consider increasing valsartan-hctz at follow up if not improving.   Future Appointments  Date Time Provider Department Center  07/06/2022  3:40 PM Sherrie Mustache, Demetrios Isaacs, MD [facility]         The entirety of the information documented in the History of Present Illness, Review of Systems and Physical Exam were personally obtained by me. Portions of this information were initially documented by the CMA and reviewed by me for thoroughness and accuracy.     [name], MD  [facility] [phone] (phone) [phone] (fax)  [facility]

## 2022-04-03 NOTE — Patient Instructions (Signed)
.   Please review the attached list of medications and notify my office if there are any errors.   . Please bring all of your medications to every appointment so we can make sure that our medication list is the same as yours.   

## 2022-05-28 ENCOUNTER — Other Ambulatory Visit: Payer: Self-pay | Admitting: Family Medicine

## 2022-05-28 NOTE — Telephone Encounter (Signed)
Requested Prescriptions  Pending Prescriptions Disp Refills   montelukast (SINGULAIR) 10 MG tablet [Pharmacy Med Name: montelukast 10 mg tablet (SINGULAIR)] 90 tablet 3    Sig: Take 1 tablet (10 mg total) by mouth daily.     Pulmonology:  Leukotriene Inhibitors Passed - 05/28/2022  9:22 AM      Passed - Valid encounter within last 12 months    Recent Outpatient Visits           1 month ago Type 2 diabetes mellitus with hyperlipidemia [facility])   [facility] Malva Limes, MD   10 months ago Left foot pain   [facility] Malva Limes, MD   1 year ago Stress incontinence of urine   [facility] Malva Limes, MD   1 year ago Annual physical exam   [facility] Malva Limes, MD   2 years ago Annual physical exam   [facility] Malva Limes, MD       Future Appointments             In 1 month Fisher, Demetrios Isaacs, MD [facility], [facility]

## 2022-06-28 ENCOUNTER — Other Ambulatory Visit: Payer: Self-pay | Admitting: Family Medicine

## 2022-07-06 ENCOUNTER — Ambulatory Visit: Payer: BC Managed Care – PPO | Admitting: Family Medicine

## 2022-07-17 ENCOUNTER — Encounter: Payer: Self-pay | Admitting: Family Medicine

## 2022-08-06 ENCOUNTER — Telehealth: Payer: BC Managed Care – PPO | Admitting: Family Medicine

## 2022-08-06 ENCOUNTER — Telehealth: Payer: Self-pay

## 2022-08-06 ENCOUNTER — Encounter: Payer: Self-pay | Admitting: Family Medicine

## 2022-08-06 VITALS — Wt 237.0 lb

## 2022-08-06 DIAGNOSIS — Z7984 Long term (current) use of oral hypoglycemic drugs: Secondary | ICD-10-CM | POA: Diagnosis not present

## 2022-08-06 DIAGNOSIS — I10 Essential (primary) hypertension: Secondary | ICD-10-CM

## 2022-08-06 DIAGNOSIS — E119 Type 2 diabetes mellitus without complications: Secondary | ICD-10-CM | POA: Diagnosis not present

## 2022-08-06 MED ORDER — DEXCOM G6 TRANSMITTER MISC: 4 refills | Status: DC

## 2022-08-06 NOTE — Addendum Note (Signed)
Addended by: Birdie Sons on: 08/06/2022 04:44 PM   Modules accepted: Level of Service

## 2022-08-06 NOTE — Telephone Encounter (Signed)
It looks like she just saw endocrinologist at [facility] in January. We do need to see her in the office at some for BP and cholesterol follow up. Recommend she reschedule this sometime within the next month.

## 2022-08-06 NOTE — Telephone Encounter (Signed)
Pt called for a status update on her request

## 2022-08-06 NOTE — Telephone Encounter (Signed)
Copied from [place] [id number]. Topic: Appointment Scheduling - Scheduling Inquiry for Clinic >> Aug 06, 2022  7:48 AM Leilani Able wrote: Reason for CRM: Pt has an appt today at 3:00 states she is having transportation problems, DM Ck up, has cancelled last 2 DM last seen Oct, wants to do virtual. Willing to do MyChart pls advise [phone]

## 2022-08-06 NOTE — Progress Notes (Signed)
[place],acting as a scribe for Lelon Huh, MD.,have documented all relevant documentation on the behalf of Lelon Huh, MD,as directed by  Lelon Huh, MD while in the presence of Lelon Huh, MD.   MyChart Video Visit    Virtual Visit via Video Note   This format is felt to be most appropriate for this patient at this time. Physical exam was limited by quality of the video and audio technology used for the visit.   Patient location: home Provider location: office  I discussed the limitations of evaluation and management by telemedicine and the availability of in person appointments. The patient expressed understanding and agreed to proceed.  Patient: Tara Byrd   DOB: 12-02-1965   57 y.o. Female  MRN: [id number] Visit Date: 08/06/2022  Today's healthcare provider: Lelon Huh, MD   No chief complaint on file.  Subjective    HPI  Diabetes Mellitus Type II, Follow-up  Lab Results  Component Value Date   HGBA1C 7.3 03/06/2022   HGBA1C 6.5 01/04/2022   HGBA1C 6.9 03/07/2021   Wt Readings from Last 3 Encounters:  07/29/20 275 lb (124.7 kg)  08/31/19 263 lb 9.6 oz (119.6 kg)  03/21/18 231 lb (104.8 kg)   Last seen for diabetes 4 months ago.  Management since then includes none. She reports good compliance with treatment. She is not having side effects.  Symptoms: Yes fatigue No foot ulcerations  No appetite changes No nausea  No paresthesia of the feet  No polydipsia  No polyuria No visual disturbances   No vomiting     Home blood sugar records:  70's-250 , per her Dexcon she is averaging glucose of 137 with A1C of 6.6.  She is planning on transferring from   Endocrinology recently started on Jardiance 66m and has one more follow up with endocrinologist before transferring here for good.   Episodes of hypoglycemia? No    Current insulin regiment: none Most Recent Eye Exam: September-obtaining records Current exercise:  none Current diet habits: well balanced  Pertinent Labs: Lab Results  Component Value Date   CHOL 109 07/25/2020   HDL 35 07/25/2020   LDLCALC 98 09/05/2021   TRIG 129 07/25/2020   Lab Results  Component Value Date   NA 140 03/07/2022   K 4.4 03/07/2022   CREATININE 0.8 03/07/2022   EGFR >90 03/07/2022   MICRALBCREAT 85.2 09/05/2021     ---------------------------------------------------------------------------------------------------    Medications: Outpatient Medications Prior to Visit  Medication Sig   albuterol (VENTOLIN HFA) 108 (90 Base) MCG/ACT inhaler INHALE 2 PUFFS INTO THE LUNGS EVERY 6 HOURS AS NEEDED   atorvastatin (LIPITOR) 20 MG tablet Take 20 mg by mouth daily.   Cyanocobalamin (VITAMIN B-12 CR) 1000 MCG TBCR Take 1 tablet by mouth daily.   cyclobenzaprine (FLEXERIL) 5 MG tablet Take 1 tablet (5 mg total) by mouth 3 (three) times daily as needed for muscle spasms.   glucose blood test strip 1 strip. As directed   metFORMIN (GLUCOPHAGE-XR) 500 MG 24 hr tablet TAKE 2 TABLETS (1,000 MG TOTAL) BY MOUTH TWICE DAILY (Patient taking differently: 500 mg 2 (two) times daily.)   montelukast (SINGULAIR) 10 MG tablet Take 1 tablet (10 mg total) by mouth daily.   OZEMPIC, 1 MG/DOSE, 2 MG/1.5ML SOPN Inject 2 mg as directed daily.   propranolol (INDERAL) 40 MG tablet Take 1 tablet (40 mg total) by mouth daily as needed   valsartan-hydrochlorothiazide (DIOVAN-HCT) 160-12.5 MG tablet Take 1 tablet by mouth  daily.   Vitamin D, Cholecalciferol, 25 MCG (1000 UT) TABS Take 1,000 Units by mouth daily.   amitriptyline (ELAVIL) 10 MG tablet Take 1-2 tablets (10-20 mg total) by mouth every evening.   Insulin Glargine (BASAGLAR KWIKPEN ) Inject 20 Units into the skin daily as needed.   No facility-administered medications prior to visit.    Review of Systems  Constitutional:  Negative for appetite change, chills, fatigue and fever.  Respiratory:  Negative for chest tightness and  shortness of breath.   Cardiovascular:  Negative for chest pain and palpitations.  Gastrointestinal:  Negative for abdominal pain, nausea and vomiting.  Neurological:  Negative for dizziness and weakness.       Objective    LMP 04/30/2019    Physical Exam   Awake, alert, oriented x 3. In no apparent distress    Assessment & Plan     1. Diabetes mellitus without complication ([place]) Doing well on current medications recently started on Jardiance by endorcinology which she is tolerating well.  - Continuous Blood Gluc Transmit (DEXCOM G6 TRANSMITTER) MISC; Use with Dexcom G6 sensor to check blood sugars for insulin dependant diabetes. Replace every 90 days  Dispense: 1 each; Refill: 4   2. Essential (primary) hypertension Much better since ankle has healed. Continue current medications.    She has follow up with endo at April at [facility] and anticipates transferring diabetes care here thereafter. Is to schedule follow up here in July.      I discussed the assessment and treatment plan with the patient. The patient was provided an opportunity to ask questions and all were answered. The patient agreed with the plan and demonstrated an understanding of the instructions.   The patient was advised to call back or seek an in-person evaluation if the symptoms worsen or if the condition fails to improve as anticipated.  I provided 12 minutes of non-face-to-face time during this encounter.  The entirety of the information documented in the History of Present Illness, Review of Systems and Physical Exam were personally obtained by me. Portions of this information were initially documented by the CMA and reviewed by me for thoroughness and accuracy.    Lelon Huh, MD [place] [phone] (phone) [phone] (fax)  [place]

## 2022-08-06 NOTE — Telephone Encounter (Signed)
Copied from [id number]. Topic: Appointment Scheduling - Scheduling Inquiry for Clinic >> Aug 06, 2022 12:54 PM Rosanne Ashing P wrote: Reason for CRM: changed appt to my chart bc she is out of town

## 2022-10-29 ENCOUNTER — Other Ambulatory Visit: Payer: Self-pay | Admitting: Family Medicine

## 2022-11-05 ENCOUNTER — Other Ambulatory Visit: Payer: Self-pay | Admitting: Family Medicine

## 2022-12-13 ENCOUNTER — Encounter: Payer: Self-pay | Admitting: Family Medicine

## 2022-12-13 ENCOUNTER — Other Ambulatory Visit: Payer: Self-pay

## 2022-12-13 MED ORDER — FREESTYLE LIBRE 3 SENSOR MISC: 1.0000 | 11 refills | Status: DC

## 2022-12-18 LAB — HM DIABETES EYE EXAM

## 2022-12-24 ENCOUNTER — Other Ambulatory Visit: Payer: Self-pay | Admitting: Family Medicine

## 2023-01-29 ENCOUNTER — Other Ambulatory Visit: Payer: Self-pay | Admitting: Family Medicine

## 2023-01-31 NOTE — Telephone Encounter (Signed)
Requested Prescriptions  Pending Prescriptions Disp Refills   propranolol (INDERAL) 40 MG tablet [Pharmacy Med Name: propranoloL 40 mg tablet (INDERAL)] 30 tablet 0    Sig: Take 1 tablet (40 mg total) by mouth daily as needed.     Cardiovascular:  Beta Blockers Failed - 01/29/2023  5:33 PM      Failed - Last BP in normal range    BP Readings from Last 1 Encounters:  03/30/22 (!) 174/105         Passed - Last Heart Rate in normal range    Pulse Readings from Last 1 Encounters:  03/30/22 99         Passed - Valid encounter within last 6 months    Recent Outpatient Visits           5 months ago Diabetes mellitus without complication [facility])   [facility] Malva Limes, MD   10 months ago Type 2 diabetes mellitus with hyperlipidemia [facility])   [place] [facility] Malva Limes, MD   1 year ago Left foot pain   [facility] Malva Limes, MD   1 year ago Stress incontinence of urine   [facility] Malva Limes, MD   2 years ago Annual physical exam   [facility] Malva Limes, MD       Future Appointments             Tomorrow Sherrie Mustache, Demetrios Isaacs, MD [facility], [facility]

## 2023-02-01 ENCOUNTER — Ambulatory Visit: Payer: BC Managed Care – PPO | Admitting: Family Medicine

## 2023-02-01 VITALS — BP 145/77 | HR 72 | Temp 98.2°F | Ht 65.0 in | Wt 242.0 lb

## 2023-02-01 DIAGNOSIS — E119 Type 2 diabetes mellitus without complications: Secondary | ICD-10-CM

## 2023-02-01 DIAGNOSIS — Z1159 Encounter for screening for other viral diseases: Secondary | ICD-10-CM

## 2023-02-01 DIAGNOSIS — D519 Vitamin B12 deficiency anemia, unspecified: Secondary | ICD-10-CM

## 2023-02-01 DIAGNOSIS — I10 Essential (primary) hypertension: Secondary | ICD-10-CM | POA: Diagnosis not present

## 2023-02-01 DIAGNOSIS — J4520 Mild intermittent asthma, uncomplicated: Secondary | ICD-10-CM

## 2023-02-01 DIAGNOSIS — M62838 Other muscle spasm: Secondary | ICD-10-CM

## 2023-02-01 DIAGNOSIS — Z7985 Long-term (current) use of injectable non-insulin antidiabetic drugs: Secondary | ICD-10-CM

## 2023-02-01 DIAGNOSIS — G4710 Hypersomnia, unspecified: Secondary | ICD-10-CM

## 2023-02-01 DIAGNOSIS — D229 Melanocytic nevi, unspecified: Secondary | ICD-10-CM

## 2023-02-01 DIAGNOSIS — G4733 Obstructive sleep apnea (adult) (pediatric): Secondary | ICD-10-CM

## 2023-02-01 DIAGNOSIS — E559 Vitamin D deficiency, unspecified: Secondary | ICD-10-CM

## 2023-02-01 MED ORDER — METFORMIN HCL ER 500 MG PO TB24
500.0000 mg | ORAL_TABLET | Freq: Two times a day (BID) | ORAL | 1 refills | Status: DC
Start: 2023-02-01 — End: 2023-12-20

## 2023-02-01 MED ORDER — VALSARTAN-HYDROCHLOROTHIAZIDE 160-12.5 MG PO TABS
1.0000 | ORAL_TABLET | Freq: Every day | ORAL | 1 refills | Status: DC
Start: 2023-02-01 — End: 2023-07-18

## 2023-02-01 MED ORDER — MONTELUKAST SODIUM 10 MG PO TABS
10.0000 mg | ORAL_TABLET | Freq: Every day | ORAL | 3 refills | Status: DC
Start: 2023-02-01 — End: 2024-02-06

## 2023-02-01 MED ORDER — CYCLOBENZAPRINE HCL 5 MG PO TABS: 5.0000 mg | ORAL_TABLET | Freq: Three times a day (TID) | ORAL | 1 refills | Status: AC | PRN

## 2023-02-01 NOTE — Progress Notes (Addendum)
Established patient visit   Patient: Tara Byrd   DOB: [birth year]   57 y.o. Female  MRN: [id number] Visit Date: 02/01/2023  Today's healthcare provider: [name], MD   Chief Complaint  Patient presents with   Nevus    Behind right ear and left jaw.   Diabetes    Patient was seen by her Endocrinologist this morning but his A1C machine was broken and she did not get the results.   Subjective    Discussed the use of AI scribe software for clinical note transcription with the patient, who gave verbal consent to proceed.  History of Present Illness   The patient, with a history of diabetes, presents for a routine check-up. Her diabetes is usually managed by Dr. Rosine Door who she saw this morning, but was not able to get A1dc checked due to lab problem. She reports discontinuing Jardiance due to a persistent yeast infection, but continues to take Ozempic. She notes a slight weight gain since stopping Jardiance. The patient also has a history of bronchitis, which has been well-controlled with Singulair, preventing any recent episodes.  She has been experiencing some cardiac symptoms, including occasional rapid heart rate, which she manages with propranolol as needed. She also reports increased fatigue and daytime sleepiness, often falling asleep during the day when sedentary, which she suspects may be related to her sleep apnea. She has been using a CPAP machine for many years on constant pressure, but expresses concern that it may no longer be effective.  The patient also brings up concerns about changes in several moles, which have increased in size and changed in appearance. There is a large one behind her right ear and a smaller one on the left side of her neck. She has not seen a dermatologist in several years.        Medications: Outpatient Medications Prior to Visit  Medication Sig   albuterol (VENTOLIN HFA) 108 (90 Base) MCG/ACT inhaler INHALE 2 PUFFS INTO THE  LUNGS EVERY 6 HOURS AS NEEDED   atorvastatin (LIPITOR) 20 MG tablet Take 20 mg by mouth daily.   Cyanocobalamin (VITAMIN B-12 CR) 1000 MCG TBCR Take 1 tablet by mouth daily.   cyclobenzaprine (FLEXERIL) 5 MG tablet Take 1 tablet (5 mg total) by mouth 3 (three) times daily as needed for muscle spasms.   glucose blood test strip 1 strip. As directed   metFORMIN (GLUCOPHAGE-XR) 500 MG 24 hr tablet TAKE 2 TABLETS (1,000 MG TOTAL) BY MOUTH TWICE DAILY (Patient taking differently: 500 mg 2 (two) times daily.)   montelukast (SINGULAIR) 10 MG tablet Take 1 tablet (10 mg total) by mouth daily.   OZEMPIC, 1 MG/DOSE, 2 MG/1.5ML SOPN Inject 2 mg as directed daily.   propranolol (INDERAL) 40 MG tablet Take 1 tablet (40 mg total) by mouth daily as needed.   valsartan-hydrochlorothiazide (DIOVAN-HCT) 160-12.5 MG tablet Take 1 tablet by mouth daily.   Vitamin D, Cholecalciferol, 25 MCG (1000 UT) TABS Take 1,000 Units by mouth daily.   amitriptyline (ELAVIL) 10 MG tablet Take 1-2 tablets (10-20 mg total) by mouth every evening.   Continuous Blood Gluc Transmit (DEXCOM G6 TRANSMITTER) MISC Use with Dexcom G6 sensor to check blood sugars for insulin dependant diabetes. Replace every 90 days   Continuous Glucose Sensor (FREESTYLE LIBRE 3 SENSOR) MISC 1 each by Does not apply route every 14 (fourteen) days. Place 1 sensor on the skin every 14 days. Use to check glucose continuously   Insulin  Glargine (BASAGLAR KWIKPEN [place]) Inject 20 Units into the skin daily as needed.   No facility-administered medications prior to visit.      Objective    BP (!) 145/77   Pulse 72   Temp 98.2 F (36.8 C) (Oral)   Ht 5\' 5"  (1.651 m)   Wt 242 lb (109.8 kg)   LMP 04/30/2019   SpO2 100%   BMI 40.27 kg/m   Physical Exam   CHEST: Lungs clear to auscultation. CARDIOVASCULAR: Heart sounds normal. EXTREMITIES: Edema in extremity with prior surgery. SKIN: Thick hyperpigmented scaly well circumscribed raised lesion as per HPI.  She has many long standing seborrheic keratosis across the malar aspect of face that she reports have not changed.       Assessment & Plan     Assessment and Plan    Type 2 Diabetes Mellitus Patient reports discontinuation of Jardiance due to adverse effects and a slight weight increase since discontinuation. Currently on Ozempic. -Check HbA1c today and send results to Dr. Rosine Door for review.  Asthma Reports Singulair is effective in preventing bronchitis episodes, but still experiences some allergy symptoms. -Continue Singulair as it is effective in preventing bronchitis.  Skin Lesions Patient reports new skin lesions that have changed in appearance. -Refer to dermatology for evaluation of new skin lesions.  Sleep Apnea Patient reports increased fatigue, daytime sleepiness,  and often falling asleep mid day or late in the day if sedentary. Currently using an old CPAP machine that she has had at least 10 years.  -Order home sleep study to update diagnosis and treatment plan. -Consider replacement of current CPAP machine with an automatic titrating CPAP machine, pending results of sleep study.  General Health Maintenance -Continue regular eye exams with ophthalmologist.   Due to check vitamin d for vitamin D deficiency, CBC for follow up anemia. Refill antihypertensive medications, check met c           No follow-ups on file.      [name], MD  [facility] [phone] (phone) [phone] (fax)  [facility]

## 2023-02-08 ENCOUNTER — Encounter: Payer: Self-pay | Admitting: Family Medicine

## 2023-02-08 MED ORDER — ALBUTEROL SULFATE HFA 108 (90 BASE) MCG/ACT IN AERS: INHALATION_SPRAY | RESPIRATORY_TRACT | 3 refills | Status: DC

## 2023-02-08 NOTE — Telephone Encounter (Signed)
Prescription refill sent.

## 2023-02-21 NOTE — Addendum Note (Signed)
Addended by: Malva Limes on: 02/21/2023 09:54 AM   Modules accepted: Orders

## 2023-03-01 ENCOUNTER — Other Ambulatory Visit: Payer: Self-pay | Admitting: Medical Genetics

## 2023-03-01 DIAGNOSIS — Z006 Encounter for examination for normal comparison and control in clinical research program: Secondary | ICD-10-CM

## 2023-03-04 ENCOUNTER — Other Ambulatory Visit: Payer: Self-pay | Admitting: Family Medicine

## 2023-03-05 ENCOUNTER — Telehealth: Payer: Self-pay

## 2023-03-05 NOTE — Telephone Encounter (Unsigned)
Copied from CRM [id number]. Topic: General - Other >> Mar 05, 2023  3:54 PM [name] E wrote: Reason for CRM: Pt called to report that she does not need to come in for follow up because she was just seen last month. Please advise   She is still waiting on the results from her sleep apnea test.

## 2023-03-05 NOTE — Telephone Encounter (Signed)
Requested medication (s) are due for refill today: Yes  Requested medication (s) are on the active medication list: yes  Last refill:  01/31/23  Future visit scheduled: No  Notes to clinic:  Left message to call and make appointment.    Requested Prescriptions  Pending Prescriptions Disp Refills   propranolol (INDERAL) 40 MG tablet [Pharmacy Med Name: propranoloL 40 mg tablet (INDERAL)] 30 tablet 0    Sig: Take 1 tablet (40 mg total) by mouth daily.     Cardiovascular:  Beta Blockers Failed - 03/04/2023 12:41 PM      Failed - Last BP in normal range    BP Readings from Last 1 Encounters:  02/01/23 (!) 145/77         Passed - Last Heart Rate in normal range    Pulse Readings from Last 1 Encounters:  02/01/23 72         Passed - Valid encounter within last 6 months    Recent Outpatient Visits           1 month ago Need for hepatitis C screening test   [facility] Malva Limes, [facility]   7 months ago Diabetes mellitus without complication [facility])   [place] Malva Limes, MD   11 months ago Type 2 diabetes mellitus with hyperlipidemia [facility])   [place] Malva Limes, MD   1 year ago Left foot pain    [facility] Malva Limes, MD   1 year ago Stress incontinence of urine   [facility] Malva Limes, MD

## 2023-03-06 ENCOUNTER — Telehealth: Payer: Self-pay | Admitting: Family Medicine

## 2023-03-06 NOTE — Telephone Encounter (Signed)
Called patient back. Apologized about the confusion. I did not call the patient. Patient reports that she received phone call about 1:45 pm today. I was not in the office but did let patient know about her previous message and Dr. Sherrie Mustache approved the Propranolol

## 2023-03-06 NOTE — Telephone Encounter (Signed)
Patient returning your call.

## 2023-03-06 NOTE — Telephone Encounter (Signed)
Patient was just seen last month. Not sure why the [facility] send a message to make appointment

## 2023-04-19 ENCOUNTER — Other Ambulatory Visit: Payer: Self-pay

## 2023-04-19 DIAGNOSIS — G4733 Obstructive sleep apnea (adult) (pediatric): Secondary | ICD-10-CM

## 2023-04-27 ENCOUNTER — Other Ambulatory Visit: Payer: BC Managed Care – PPO

## 2023-04-30 LAB — HM MAMMOGRAPHY

## 2023-05-08 ENCOUNTER — Encounter: Payer: Self-pay | Admitting: Family Medicine

## 2023-05-10 MED ORDER — ACETONE (URINE) TEST VI STRP: 1.0000 | ORAL_STRIP | 0 refills | Status: DC | PRN

## 2023-05-14 ENCOUNTER — Other Ambulatory Visit: Payer: BC Managed Care – PPO

## 2023-07-13 ENCOUNTER — Other Ambulatory Visit: Payer: Self-pay

## 2023-07-18 ENCOUNTER — Other Ambulatory Visit: Payer: Self-pay | Admitting: Family Medicine

## 2023-07-18 DIAGNOSIS — I10 Essential (primary) hypertension: Secondary | ICD-10-CM

## 2023-07-20 ENCOUNTER — Other Ambulatory Visit: Payer: Self-pay

## 2023-08-05 ENCOUNTER — Other Ambulatory Visit: Payer: Self-pay | Admitting: Family Medicine

## 2023-08-05 DIAGNOSIS — J4520 Mild intermittent asthma, uncomplicated: Secondary | ICD-10-CM

## 2023-08-06 NOTE — Telephone Encounter (Signed)
Rx was sent to pharmacy on 02/01/23 #90/3.   Requested Prescriptions  Refused Prescriptions Disp Refills   montelukast (SINGULAIR) 10 MG tablet [Pharmacy Med Name: montelukast 10 mg tablet (SINGULAIR)] 90 tablet 3    Sig: Take 1 tablet (10 mg total) by mouth daily.     Pulmonology:  Leukotriene Inhibitors Passed - 08/06/2023 10:11 AM      Passed - Valid encounter within last 12 months    Recent Outpatient Visits           6 months ago Need for hepatitis C screening test   [facility], [facility]   1 year ago Diabetes mellitus without complication [facility])   [facility] Malva Limes, MD   1 year ago Type 2 diabetes mellitus with hyperlipidemia [facility])   [place] [facility] Malva Limes, MD   2 years ago Left foot pain   [facility] Malva Limes, MD   2 years ago Stress incontinence of urine   [facility] Malva Limes, MD

## 2023-08-09 ENCOUNTER — Other Ambulatory Visit: Payer: Self-pay

## 2023-09-17 ENCOUNTER — Encounter: Payer: Self-pay | Admitting: Family Medicine

## 2023-09-27 ENCOUNTER — Other Ambulatory Visit: Payer: Self-pay

## 2023-09-27 LAB — HEMOGLOBIN A1C: Hemoglobin A1C: 7.4

## 2023-11-04 ENCOUNTER — Other Ambulatory Visit: Payer: Self-pay | Admitting: Family Medicine

## 2023-11-04 DIAGNOSIS — I10 Essential (primary) hypertension: Secondary | ICD-10-CM

## 2023-11-24 ENCOUNTER — Other Ambulatory Visit: Payer: Self-pay | Admitting: Family Medicine

## 2023-11-24 DIAGNOSIS — I10 Essential (primary) hypertension: Secondary | ICD-10-CM

## 2023-11-29 ENCOUNTER — Other Ambulatory Visit: Payer: Self-pay | Admitting: Family Medicine

## 2023-12-20 ENCOUNTER — Ambulatory Visit (INDEPENDENT_AMBULATORY_CARE_PROVIDER_SITE_OTHER): Admitting: Family Medicine

## 2023-12-20 ENCOUNTER — Encounter: Payer: Self-pay | Admitting: Family Medicine

## 2023-12-20 VITALS — BP 129/88 | HR 82 | Ht 64.0 in | Wt 249.7 lb

## 2023-12-20 DIAGNOSIS — Z0000 Encounter for general adult medical examination without abnormal findings: Secondary | ICD-10-CM

## 2023-12-20 DIAGNOSIS — Z23 Encounter for immunization: Secondary | ICD-10-CM

## 2023-12-20 DIAGNOSIS — E559 Vitamin D deficiency, unspecified: Secondary | ICD-10-CM

## 2023-12-20 DIAGNOSIS — Z0001 Encounter for general adult medical examination with abnormal findings: Secondary | ICD-10-CM

## 2023-12-20 DIAGNOSIS — E785 Hyperlipidemia, unspecified: Secondary | ICD-10-CM

## 2023-12-20 DIAGNOSIS — G4733 Obstructive sleep apnea (adult) (pediatric): Secondary | ICD-10-CM

## 2023-12-20 DIAGNOSIS — I10 Essential (primary) hypertension: Secondary | ICD-10-CM

## 2023-12-20 DIAGNOSIS — E1169 Type 2 diabetes mellitus with other specified complication: Secondary | ICD-10-CM

## 2023-12-20 NOTE — Progress Notes (Signed)
 Complete physical exam   Patient: Tara Byrd   DOB: 1966-06-24   58 y.o. Female  MRN: [id number] Visit Date: 12/20/2023  Today's healthcare provider: Nancyann Perry, MD   Chief Complaint  Patient presents with   Annual Exam   Subjective    Discussed the use of AI scribe software for clinical note transcription with the patient, who gave verbal consent to proceed.  History of Present Illness   Tara Byrd is a 58 year old female who presents for an annual physical exam.  She continues to follow up with [facility] for diabetes. Her blood sugar, previously well-controlled, has recently increased. She switched back to Mounjaro in April after previously being on Ozempic  due to insurance changes. She started with a dose of 5 mg, increased to 7.5 mg, and is currently on 10 mg. She also takes metformin  1000 mg twice a day.  She is currently taking Lipitor for cholesterol management and is due for a cholesterol check.  She experiences a 'swishing' sound in her ears, described as being in sync with her heartbeat, which she finds annoying. No issues with hearing except for the swishing sound.  She reports swelling in her ankle, attributed to a previous surgery, and mentions ongoing problems with the ankle. No swelling in her hands or feet except for the ankle swelling related to surgery.  She takes Flexeril  for muscle spasms in her throat, which she finds annoying, but does not currently need a refill.       Past Medical History:  Diagnosis Date   Allergy 2005   Scents  year round allergies   Anemia    Anxiety    Asthma    Diabetes mellitus without complication (HCC)    Hypertension    Sleep apnea    CPAP - only uses sometimes   Past Surgical History:  Procedure Laterality Date   ABLATION  06/25/2002   Cardiac   ANKLE ARTHROSCOPY Left 03/06/2022   w implant. [facility]   CESAREAN SECTION  06/25/1984   COLONOSCOPY WITH PROPOFOL  N/A 09/06/2016    Procedure: COLONOSCOPY WITH PROPOFOL ;  Surgeon: Rogelia Copping, MD;  [facility];  Service: Endoscopy;  Laterality: N/A;  Diabetic - insulin sleep apnea   Social History   Socioeconomic History   Marital status: Widowed    Spouse name: Not on file   Number of children: 1   Years of education: College   Highest education level: Bachelor's degree (e.g., BA, AB, BS)  Occupational History   Occupation: Employed    Comment: works at [employer]  Tobacco Use   Smoking status: Former    Current packs/day: 0.00    Average packs/day: 0.5 packs/day for 20.0 years (10.0 ttl pk-yrs)    Types: Cigarettes    Start date: 06/25/1982    Quit date: 06/25/2002    Years since quitting: 21.5   Smokeless tobacco: Never  Substance and Sexual Activity   Alcohol use: No   Drug use: No   Sexual activity: Not Currently    Birth control/protection: Abstinence  Other Topics Concern   Not on file  Social History Narrative   Not on file   Social Drivers of Health   Financial Resource Strain: Low Risk  (01/29/2023)   Overall Financial Resource Strain (CARDIA)    Difficulty of Paying Living Expenses: Not hard at all  Food Insecurity: No Food Insecurity (01/29/2023)   Hunger Vital Sign    Worried About Running Out  of Food in the Last Year: Never true    Ran Out of Food in the Last Year: Never true  Transportation Needs: No Transportation Needs (01/29/2023)   PRAPARE - Administrator, Civil Service (Medical): No    Lack of Transportation (Non-Medical): No  Physical Activity: Insufficiently Active (01/29/2023)   Exercise Vital Sign    Days of Exercise per Week: 2 days    Minutes of Exercise per Session: 60 min  Stress: No Stress Concern Present (01/29/2023)   [employer] of Occupational Health - Occupational Stress Questionnaire    Feeling of Stress : Only a little  Social Connections: Moderately Integrated (01/29/2023)   Social Connection and Isolation Panel    Frequency of  Communication with Friends and Family: Three times a week    Frequency of Social Gatherings with Friends and Family: More than three times a week    Attends Religious Services: More than 4 times per year    Active Member of Clubs or Organizations: Yes    Attends Banker Meetings: More than 4 times per year    Marital Status: Widowed  Intimate Partner Violence: Not At Risk (08/28/2023)   Received from [facility]   Humiliation, Afraid, Rape, and Kick questionnaire    Within the last year, have you been afraid of your partner or ex-partner?: No    Within the last year, have you been humiliated or emotionally abused in other ways by your partner or ex-partner?: No    Within the last year, have you been kicked, hit, slapped, or otherwise physically hurt by your partner or ex-partner?: No    Within the last year, have you been raped or forced to have any kind of sexual activity by your partner or ex-partner?: No   Family Status  Relation Name Status   Mother Dorothyann Deceased at age 44       Cause of death: Renal Failure   Father Lynwood Alive   Sister Donny Pouch Alive   Brother Lamar Alive   [facility] Alive   Son Kurtis [place] Alive  No partnership data on file   Family History  Problem Relation Age of Onset   Diabetes Mother    Kidney disease Mother    Heart failure Mother    Hypertension Mother    Stroke Mother    Diabetes Father    Colon polyps Father 42   Hypertension Father    Kidney disease Father    Diabetes Sister    Arthritis Sister    Heart disease Sister    Hypertension Sister    Kidney disease Sister    Stroke Sister    Vision loss Sister    Asthma Brother    Ulcers Brother    Cancer Maternal Grandmother    ADD / ADHD Son    Allergies  Allergen Reactions   Almond (Diagnostic) Shortness Of Breath    Pt states nuts cause SOB   Aspirin Hives   Justicia Adhatoda (Malabar Nut Tree)  [Justicia Adhatoda] Shortness Of Breath   Other  Shortness Of Breath    Perfume  almonds   Tomato Itching and Hives    Roma tomatoes only   Lisinopril Other (See Comments)    cough   Chocolate Hives and Itching   Enalapril Maleate Cough    Patient Care Team: Gasper Nancyann BRAVO, MD as PCP - General (Family Medicine) Jennelle Gunner, MD as Referring Physician (Endocrinology) System, Provider Not In [place],  Kristi (Optometry) Garnett Schooner, Sheree Bong, MD as Consulting Physician (Endocrinology)   Medications: Outpatient Medications Prior to Visit  Medication Sig Note   albuterol  (VENTOLIN  HFA) 108 (90 Base) MCG/ACT inhaler INHALE 2 PUFFS INTO THE LUNGS EVERY 6 HOURS AS NEEDEDINHALE 2 PUFFS INTO THE LUNGS EVERY 6 HOURS AS NEEDED    atorvastatin (LIPITOR) 20 MG tablet Take 20 mg by mouth daily.    Cyanocobalamin (VITAMIN B-12 CR) 1000 MCG TBCR Take 1 tablet by mouth daily.    cyclobenzaprine  (FLEXERIL ) 5 MG tablet Take 1 tablet (5 mg total) by mouth 3 (three) times daily as needed for muscle spasms.    glucose blood test strip 1 strip. As directed    ketorolac (TORADOL) 10 MG tablet Take 10 mg by mouth every 6 (six) hours as needed.    LINZESS 145 MCG CAPS capsule Take 145 mcg by mouth.    metFORMIN  (GLUCOPHAGE -XR) 500 MG 24 hr tablet Take 1,000 mg by mouth. (Patient taking differently: Take 1,000 mg by mouth in the morning and at bedtime.)    montelukast  (SINGULAIR ) 10 MG tablet Take 1 tablet (10 mg total) by mouth daily.    MOUNJARO 10 MG/0.5ML Pen Inject 10 mg into the skin.    propranolol  (INDERAL ) 40 MG tablet Take 1 tablet (40 mg total) by mouth daily.    valsartan -hydrochlorothiazide  (DIOVAN -HCT) 160-12.5 MG tablet Take 1 tablet by mouth daily.    Vitamin D , Cholecalciferol, 25 MCG (1000 UT) TABS Take 1,000 Units by mouth daily.    [DISCONTINUED] acetone, urine, test strip 1 strip by Does not apply route as needed for high blood sugar.    [DISCONTINUED] metFORMIN  (GLUCOPHAGE -XR) 500 MG 24 hr tablet Take 1 tablet (500 mg total) by  mouth 2 (two) times daily.    [DISCONTINUED] OZEMPIC , 1 MG/DOSE, 2 MG/1.5ML SOPN Inject 2 mg as directed daily. 12/20/2023: changed to mounjaro   No facility-administered medications prior to visit.    Review of Systems  Constitutional:  Negative for appetite change, chills, fatigue and fever.  Respiratory:  Negative for chest tightness and shortness of breath.   Cardiovascular:  Negative for chest pain and palpitations.  Gastrointestinal:  Negative for abdominal pain, nausea and vomiting.  Neurological:  Negative for dizziness and weakness.      Objective    BP 129/88 (BP Location: Left Arm, Patient Position: Sitting, Cuff Size: Normal)   Pulse 82   Ht 5' 4 (1.626 m)   Wt 249 lb 11.2 oz (113.3 kg)   LMP 04/30/2019   SpO2 100%   BMI 42.86 kg/m    Physical Exam   General Appearance:    Severely obese female. Alert, cooperative, in no acute distress, appears stated age   Head:    Normocephalic, without obvious abnormality, atraumatic  Eyes:    PERRL, conjunctiva/corneas clear, EOM's intact, fundi    benign, both eyes  Ears:    Normal TM's and external ear canals, both ears  Nose:   Nares normal, septum midline, mucosa normal, no drainage    or sinus tenderness  Throat:   Lips, mucosa, and tongue normal; teeth and gums normal  Neck:   Supple, symmetrical, trachea midline, no adenopathy;    thyroid:  no enlargement/tenderness/nodules; no carotid   bruit or JVD  Back:     Symmetric, no curvature, ROM normal, no CVA tenderness  Lungs:     Clear to auscultation bilaterally, respirations unlabored  Chest Wall:    No tenderness or deformity  Heart:    Normal heart rate. Normal rhythm. No murmurs, rubs, or gallops.   Breast Exam:    deferred  Abdomen:     Soft, non-tender, bowel sounds active all four quadrants,    no masses, no organomegaly  Pelvic:    deferred  Extremities:   All extremities are intact. No cyanosis or edema  Pulses:   2+ and symmetric all extremities  Skin:    Skin color, texture, turgor normal, no rashes or lesions  Lymph nodes:   Cervical, supraclavicular, and axillary nodes normal  Neurologic:   CNII-XII intact, normal strength, sensation and reflexes    throughout     Last depression screening scores    02/01/2023   11:19 AM 03/30/2022    4:20 PM 07/29/2020    1:53 PM  PHQ 2/9 Scores  PHQ - 2 Score 0 1 0  PHQ- 9 Score 4 11 2    Last fall risk screening    02/01/2023   11:19 AM  Fall Risk   Falls in the past year? 0  Number falls in past yr: 0  Injury with Fall? 0   Last Audit-C alcohol use screening    01/29/2023    5:31 PM  Alcohol Use Disorder Test (AUDIT)  1. How often do you have a drink containing alcohol? 0  3. How often do you have six or more drinks on one occasion? 0   A score of 3 or more in women, and 4 or more in men indicates increased risk for alcohol abuse, EXCEPT if all of the points are from question 1   No results found for any visits on 12/20/23.  Assessment & Plan    Routine Health Maintenance and Physical Exam  Exercise Activities and Dietary recommendations  Goals   None     Immunization History  Administered Date(s) Administered   Influenza, Mdck, Trivalent,PF 6+ MOS(egg free) 03/05/2023   Influenza,inj,Quad PF,6+ Mos 03/13/2022   Influenza-Unspecified 03/02/2016, 02/25/2017, 02/28/2018, 02/25/2019, 03/17/2020, 03/10/2021   PFIZER Comirnaty(Gray Top)Covid-19 Tri-Sucrose Vaccine 10/20/2020   PFIZER(Purple Top)SARS-COV-2 Vaccination 07/22/2019, 08/12/2019, 04/01/2020   PNEUMOCOCCAL CONJUGATE-20 12/20/2023   Pfizer Covid-19 Vaccine Bivalent Booster 39yrs & up 03/10/2021   Pfizer(Comirnaty)Fall Seasonal Vaccine 12 years and older 03/15/2022, 03/05/2023   Pneumococcal Polysaccharide-23 07/25/2012   Td 03/21/2018   Tdap 06/25/2006   Zoster Recombinant(Shingrix ) 03/21/2018, 05/30/2018    Health Maintenance  Topic Date Due   HIV Screening  Never done   Hepatitis B Vaccines (1 of 3 - 19+ 3-dose  series) Never done   HEMOGLOBIN A1C  08/04/2023   OPHTHALMOLOGY EXAM  12/18/2023   INFLUENZA VACCINE  01/24/2024   Diabetic kidney evaluation - eGFR measurement  02/01/2024   Diabetic kidney evaluation - Urine ACR  02/01/2024   MAMMOGRAM  04/29/2024   Cervical Cancer Screening (HPV/Pap Cotest)  07/29/2025   Colonoscopy  09/07/2026   DTaP/Tdap/Td (3 - Td or Tdap) 03/21/2028   Pneumococcal Vaccine 45-64 Years old  Completed   COVID-19 Vaccine  Completed   Hepatitis C Screening  Completed   Zoster Vaccines- Shingrix   Completed   HPV VACCINES  Aged Out   Meningococcal B Vaccine  Aged Out    Discussed health benefits of physical activity, and encouraged her to engage in regular exercise appropriate for her age and condition.  Pap in 2027   2. Need for prophylactic vaccination against Streptococcus pneumoniae (pneumococcus)  - Pneumococcal conjugate vaccine 20-valent (Prevnar 20)  3. Essential (primary) hypertension Well controlled.  Continue current medications.    4. Type 2 diabetes mellitus with hyperlipidemia (HCC)  - CBC - Lipid panel  5. Vitamin D  deficiency  - VITAMIN D  25 Hydroxy (Vit-D Deficiency, Fractures)  6. OSA on CPAP       Nancyann Perry, MD  [facility] [phone] (phone) [phone] (fax)  [facility]

## 2023-12-21 LAB — CBC
Hematocrit: 35.7 % (ref 34.0–46.6)
Hemoglobin: 11.1 g/dL (ref 11.1–15.9)
MCH: 26.3 pg — ABNORMAL LOW (ref 26.6–33.0)
MCHC: 31.1 g/dL — ABNORMAL LOW (ref 31.5–35.7)
MCV: 85 fL (ref 79–97)
Platelets: 402 10*3/uL (ref 150–450)
RBC: 4.22 x10E6/uL (ref 3.77–5.28)
RDW: 14.1 % (ref 11.7–15.4)
WBC: 6.9 10*3/uL (ref 3.4–10.8)

## 2023-12-21 LAB — LIPID PANEL
Chol/HDL Ratio: 2.5 ratio (ref 0.0–4.4)
Cholesterol, Total: 109 mg/dL (ref 100–199)
HDL: 44 mg/dL (ref >39)
LDL Chol Calc (NIH): 46 mg/dL (ref 0–99)
Triglycerides: 99 mg/dL (ref 0–149)
VLDL Cholesterol Cal: 19 mg/dL (ref 5–40)

## 2023-12-21 LAB — VITAMIN D 25 HYDROXY (VIT D DEFICIENCY, FRACTURES): Vit D, 25-Hydroxy: 49.3 ng/mL (ref 30.0–100.0)

## 2023-12-22 ENCOUNTER — Ambulatory Visit: Payer: Self-pay | Admitting: Family Medicine

## 2024-02-06 ENCOUNTER — Other Ambulatory Visit: Payer: Self-pay | Admitting: Family Medicine

## 2024-02-06 DIAGNOSIS — J4520 Mild intermittent asthma, uncomplicated: Secondary | ICD-10-CM

## 2024-02-17 ENCOUNTER — Other Ambulatory Visit

## 2024-02-23 ENCOUNTER — Other Ambulatory Visit: Payer: Self-pay | Admitting: Family Medicine

## 2024-02-23 DIAGNOSIS — I10 Essential (primary) hypertension: Secondary | ICD-10-CM

## 2024-03-02 ENCOUNTER — Other Ambulatory Visit: Payer: Self-pay | Admitting: Family Medicine

## 2024-03-31 LAB — OPHTHALMOLOGY REPORT-SCANNED

## 2024-04-22 ENCOUNTER — Other Ambulatory Visit: Payer: Self-pay | Admitting: Medical Genetics

## 2024-04-22 DIAGNOSIS — Z006 Encounter for examination for normal comparison and control in clinical research program: Secondary | ICD-10-CM

## 2024-05-27 LAB — GENECONNECT MOLECULAR SCREEN: Genetic Analysis Overall Interpretation: NEGATIVE
